# Patient Record
Sex: Male | Born: 1995 | Hispanic: Yes | Marital: Married | Smoking: Former smoker
Health system: Southern US, Community
[De-identification: ages and names within clinical notes are randomized; demographics above are authoritative.]

## PROBLEM LIST (undated history)

## (undated) ENCOUNTER — Emergency Department (HOSPITAL_COMMUNITY): Admission: EM | Payer: MEDICAID | Source: Home / Self Care

## (undated) DIAGNOSIS — K8020 Calculus of gallbladder without cholecystitis without obstruction: Secondary | ICD-10-CM

---
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
## 2016-07-19 IMAGING — CT CT NECK W/ CM
4 of 5 series · 14 of 33 positions shown, 16 images · IV contrast (omnipaque)
Comparison: None.

CLINICAL DATA: Acute onset of swelling of the right side of the
neck which began 1 week ago. No known injury. No significant past
medical history.

EXAM:
CT NECK WITH CONTRAST
TECHNIQUE: Multidetector CT imaging of the neck was performed using the
standard protocol following the bolus administration of intravenous
contrast.
CONTRAST:  80mL OMNIPAQUE IOHEXOL 300 MG/ML IV.

[Series 2: neck 2.0 i31s 3 · axial · 0.44mm/px · z∈[-266,-104]mm · 4 of 136 slices shown, 5 images]
[im 28/136  soft-tissue]
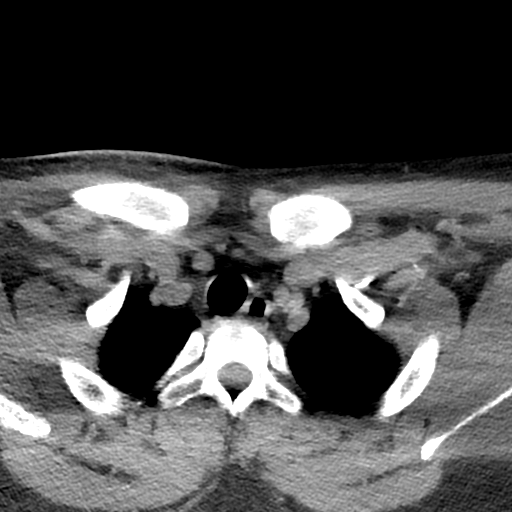
[im 28/136  bone]
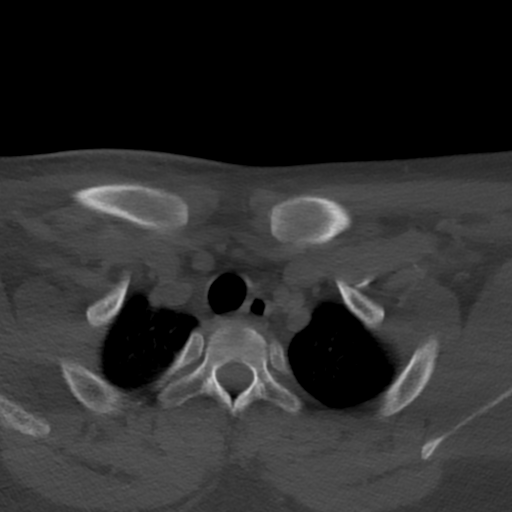
[im 55/136  bone]
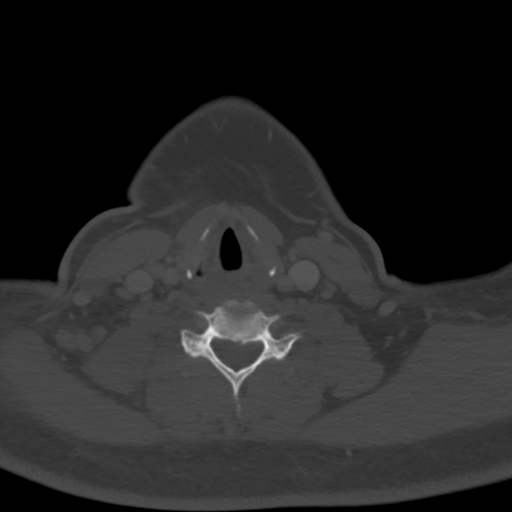
[im 82/136  bone]
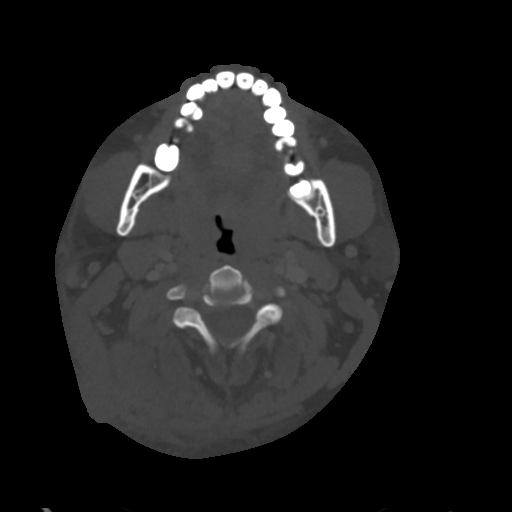
[im 109/136  bone]
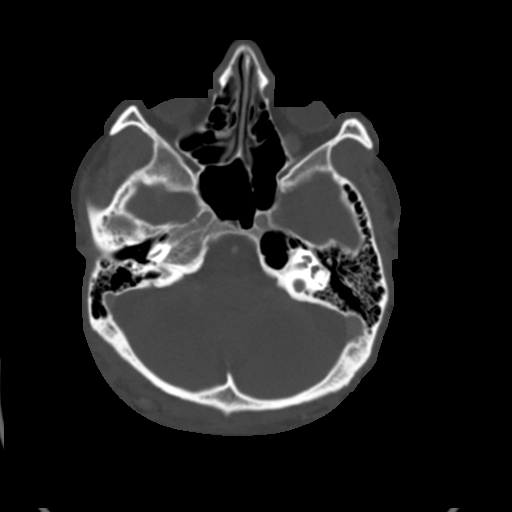

[Series 5: coronal st · coronal · 0.39mm/px · 3 of 111 slices shown]
[im 23/111  bone]
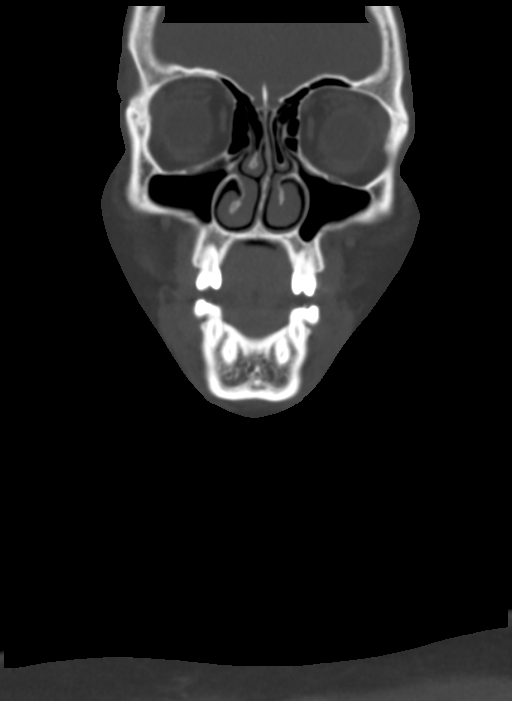
[im 45/111  bone]
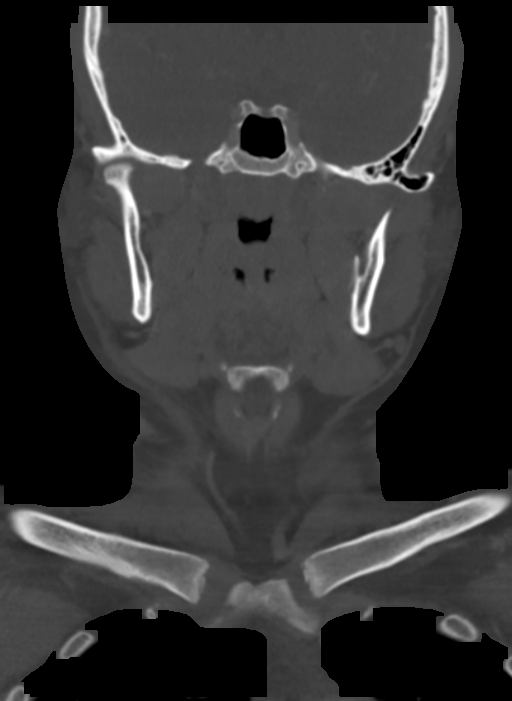
[im 67/111  bone]
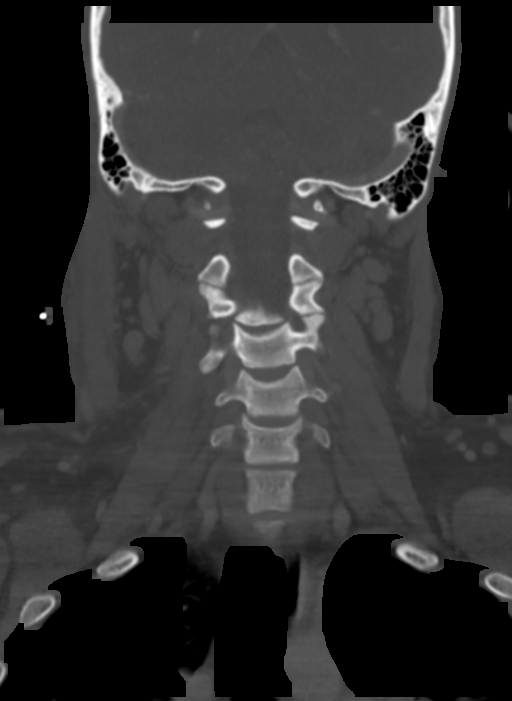

[Series 6: sagittal st · sagittal · 0.46mm/px · 5 of 92 slices shown, 6 images]
[im 31/92  bone]
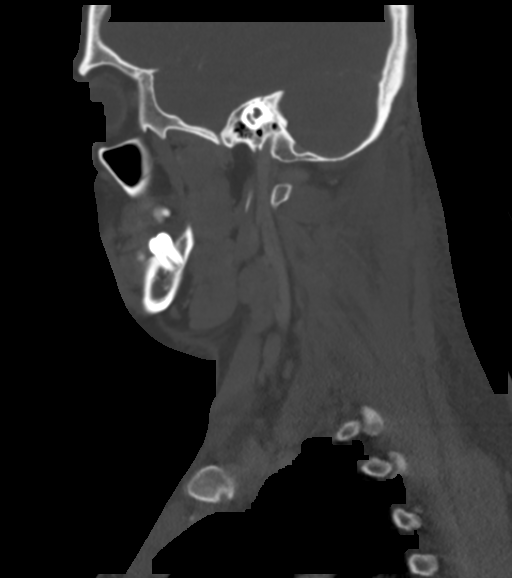
[im 38/92  bone]
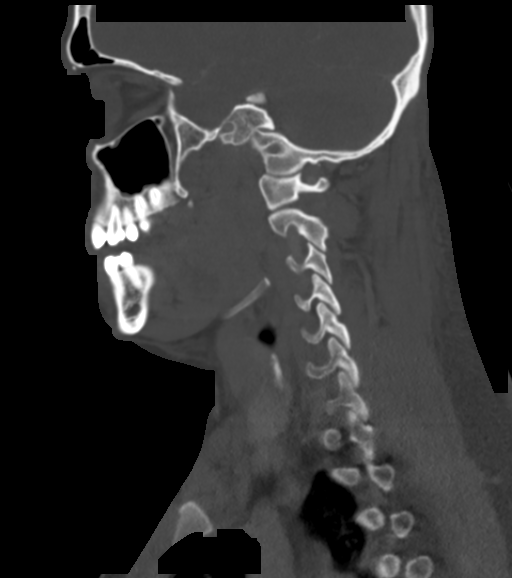
[im 46/92  soft-tissue]
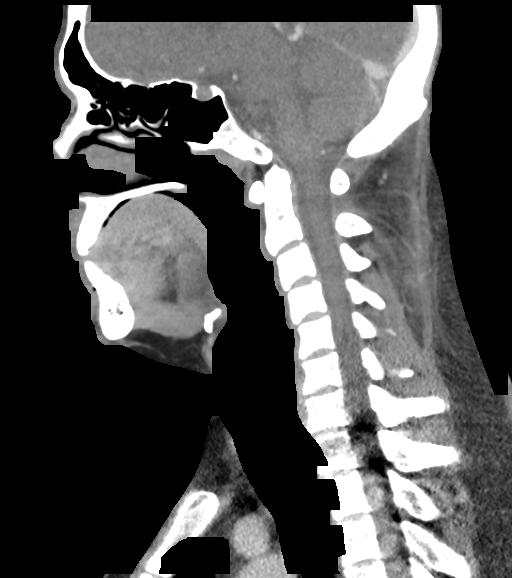
[im 46/92  bone]
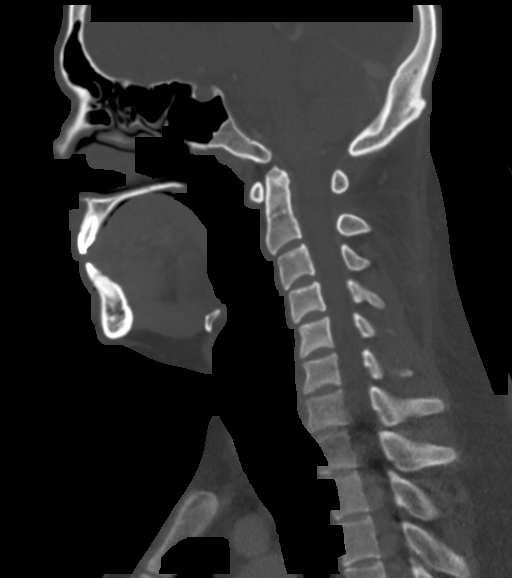
[im 54/92  bone]
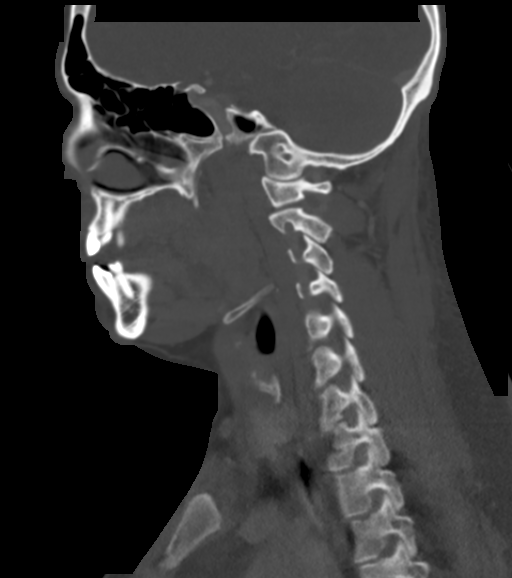
[im 61/92  bone]
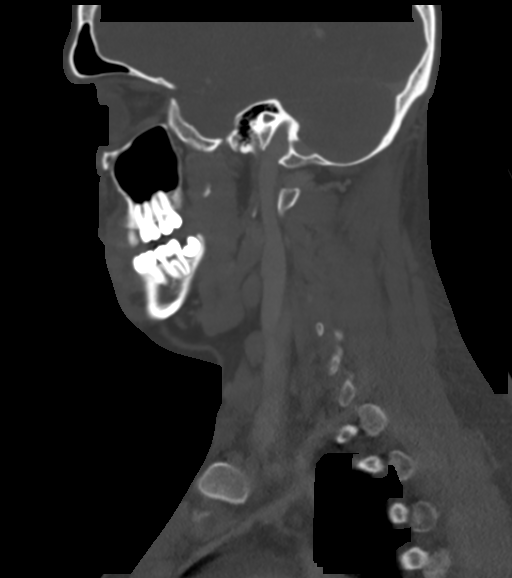

[Series 7: orthogonal st · axial · 0.39mm/px · z∈[-258,-206]mm · 2 of 129 slices shown]
[im 26/129  bone]
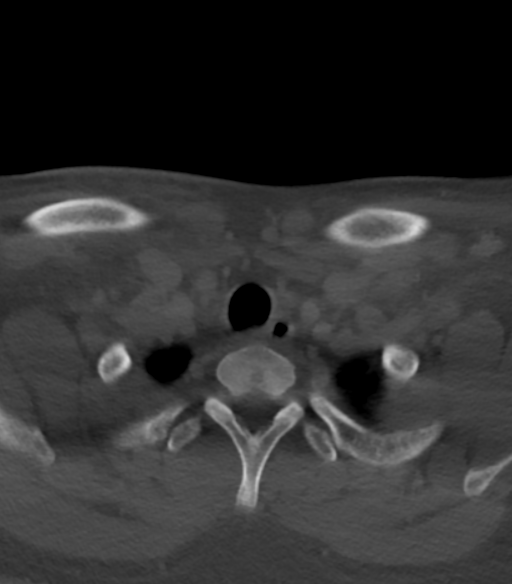
[im 52/129  bone]
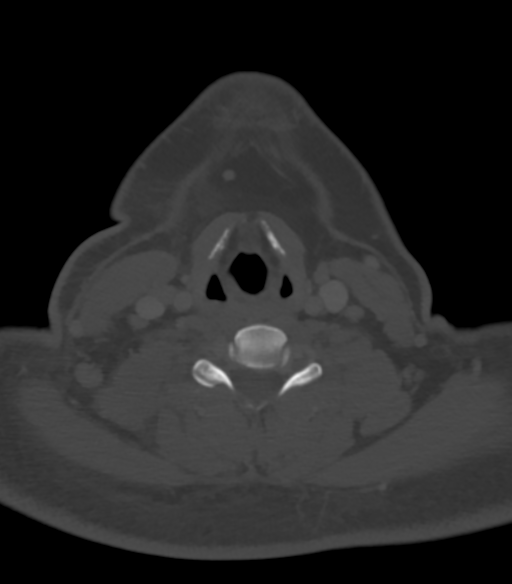

[14 of 33 positions shown; findings below may reference images not displayed]

FINDINGS: Extensive induration/edema involving the subcutaneous fat with
extension through the superficial fascia in the right posterior
neck. There is no discrete fluid collection to confirm an abscess
currently, though there is more focal with phlegmonous change in the
subcutaneous fat which measures approximately 2.8 x 2.6 x 3.9 cm
which appears to be organizing into an abscess. There is
edema/inflammation involving the posterior right cervical
musculature, including the right sternocleidomastoid muscle, with
edema in the fat planes between these muscles.

Pharynx and larynx: Symmetric mildly prominent lingual and
parapharyngeal tonsillar tissue, normal for patient age. No evidence
of parapharyngeal abscess.

Salivary glands: Normal and symmetric bilaterally.

Thyroid: Normal.

Lymph nodes: Symmetric bilateral enlarged level 2A cervical nodes.
Scattered bubbles mildly enlarged bilateral level IIb cervical
nodes, right greater than left. Mildly enlarged right level 5 nodes.
These nodes are felt to be reactive to the inflammatory process
described above.

Vascular: Patent internal jugular veins and external jugular veins
bilaterally. No carotid atherosclerosis.

Limited intracranial: Unremarkable.

Visualized orbits: Normal.

Mastoids and visualized paranasal sinuses: Well-aerated.

Skeleton: Unremarkable.

Upper chest: Visualized lung apices clear. Thymic tissue in the
anterior superior mediastinum. No lymphadenopathy in the visualized
mediastinum.
IMPRESSION: 1. Severe edema/inflammation involving the right posterior neck with
involvement of the subcutaneous fat and extension through the
superficial fascia to involve the posterior right neck muscles,
including the sternocleidomastoid muscle.
2. More focal phlegmonous change in the subcutaneous fat which may
be organizing into an abscess, though there is no drainable fluid
collection currently.
3. Reactive lymphadenopathy in both sides of the neck, right greater
than left.

## 2020-11-15 ENCOUNTER — Encounter (HOSPITAL_COMMUNITY): Payer: Self-pay

## 2020-11-15 ENCOUNTER — Other Ambulatory Visit: Payer: Self-pay

## 2020-11-15 ENCOUNTER — Emergency Department (HOSPITAL_COMMUNITY): Payer: Self-pay

## 2020-11-15 ENCOUNTER — Emergency Department (HOSPITAL_COMMUNITY)
Admission: EM | Admit: 2020-11-15 | Discharge: 2020-11-15 | Disposition: A | Discharge status: Home / Self Care | Payer: Self-pay | Attending: Emergency Medicine | Admitting: Emergency Medicine

## 2020-11-15 DIAGNOSIS — F17210 Nicotine dependence, cigarettes, uncomplicated: Secondary | ICD-10-CM | POA: Insufficient documentation

## 2020-11-15 DIAGNOSIS — R1011 Right upper quadrant pain: Secondary | ICD-10-CM

## 2020-11-15 DIAGNOSIS — K8020 Calculus of gallbladder without cholecystitis without obstruction: Secondary | ICD-10-CM | POA: Insufficient documentation

## 2020-11-15 LAB — COMPREHENSIVE METABOLIC PANEL
ALT: 47 U/L — ABNORMAL HIGH (ref 0–44)
AST: 18 U/L (ref 15–41)
Albumin: 4.1 g/dL (ref 3.5–5.0)
Alkaline Phosphatase: 114 U/L (ref 38–126)
Anion gap: 8 (ref 5–15)
BUN: 10 mg/dL (ref 6–20)
CO2: 24 mmol/L (ref 22–32)
Calcium: 8.7 mg/dL — ABNORMAL LOW (ref 8.9–10.3)
Chloride: 103 mmol/L (ref 98–111)
Creatinine, Ser: 0.73 mg/dL (ref 0.61–1.24)
GFR, Estimated: >60 mL/min (ref >60)
Glucose, Bld: 105 mg/dL — ABNORMAL HIGH (ref 70–99)
Potassium: 4.2 mmol/L (ref 3.5–5.1)
Sodium: 135 mmol/L (ref 135–145)
Total Bilirubin: 0.5 mg/dL (ref 0.3–1.2)
Total Protein: 6.7 g/dL (ref 6.5–8.1)

## 2020-11-15 LAB — CBC WITH DIFFERENTIAL/PLATELET
Abs Immature Granulocytes: 0.03 10*3/uL (ref 0.00–0.07)
Basophils Absolute: 0 10*3/uL (ref 0.0–0.1)
Basophils Relative: 0 %
Eosinophils Absolute: 0.2 10*3/uL (ref 0.0–0.5)
Eosinophils Relative: 2 %
HCT: 44.2 % (ref 39.0–52.0)
Hemoglobin: 14.3 g/dL (ref 13.0–17.0)
Immature Granulocytes: 0 %
Lymphocytes Relative: 24 %
Lymphs Abs: 2.4 10*3/uL (ref 0.7–4.0)
MCH: 28.3 pg (ref 26.0–34.0)
MCHC: 32.4 g/dL (ref 30.0–36.0)
MCV: 87.4 fL (ref 80.0–100.0)
Monocytes Absolute: 1 10*3/uL (ref 0.1–1.0)
Monocytes Relative: 10 %
Neutro Abs: 6.3 10*3/uL (ref 1.7–7.7)
Neutrophils Relative %: 64 %
Platelets: 239 10*3/uL (ref 150–400)
RBC: 5.06 MIL/uL (ref 4.22–5.81)
RDW: 12.4 % (ref 11.5–15.5)
WBC: 9.8 10*3/uL (ref 4.0–10.5)
nRBC: 0 % (ref 0.0–0.2)

## 2020-11-15 LAB — LIPASE, BLOOD: Lipase: 31 U/L (ref 11–51)

## 2020-11-15 MED ORDER — OXYCODONE-ACETAMINOPHEN 5-325 MG PO TABS: 1.0000 | ORAL_TABLET | ORAL | 0 refills | Status: DC | PRN

## 2020-11-15 MED ORDER — ONDANSETRON 4 MG PO TBDP
4.0000 mg | ORAL_TABLET | Freq: Once | ORAL | Status: AC
  Administered 2020-11-15: 4 mg via ORAL
  Filled 2020-11-15: qty 1

## 2020-11-15 MED ORDER — OXYCODONE-ACETAMINOPHEN 5-325 MG PO TABS
2.0000 | ORAL_TABLET | Freq: Once | ORAL | Status: AC
  Administered 2020-11-15: 2 via ORAL
  Filled 2020-11-15: qty 2

## 2020-11-15 MED ORDER — ONDANSETRON 4 MG PO TBDP: 4.0000 mg | ORAL_TABLET | Freq: Three times a day (TID) | ORAL | 0 refills | Status: DC | PRN

## 2020-11-15 NOTE — ED Triage Notes (Addendum)
Patient arrived with complaints of RUQ pain that radiates to his back over the last few hours but states this has happened in the past before and it resolved on its own. Reports taking pepto bismol and tylenol prior to arrival.

## 2020-11-15 NOTE — ED Notes (Signed)
Discharge instructions reviewed with patient. Medications and rx explained. Pt understands and repeats instructions. Pt verbalizes he will follow up with surgery.

## 2020-11-15 NOTE — ED Notes (Signed)
Pt reports recurrent RUQ pain that began at 0100. Pt sts pain usually resolves on own but today was worse.

## 2020-11-15 NOTE — Discharge Instructions (Signed)
Your labs today were normal.  Ultrasound showed gallstones, this is the source of your pain. See attached for more information about this.  Recommend to try eating low fat diet to reduce flare ups. Take the prescribed medication as directed for pain/nausea when needed. Follow-up in the general surgery clinic-- call for appointment. Return to the ED for new or worsening symptoms.

## 2020-11-15 NOTE — ED Provider Notes (Signed)
[facility] DEPT Provider Note   CSN: [id number] Arrival date & time: 11/15/20  0511     History Chief Complaint  Patient presents with  . Abdominal Pain    Andrew Rowe is a 25 y.o. male.  The history is provided by the patient and medical records.  Abdominal Pain Associated symptoms: nausea     25 year old male presenting to the ED with epigastric and right upper quadrant pain that began tonight.  States it is sharp and stabbing and radiates around to his right back.  Has had some nausea but denies vomiting.  Cannot specify pain is worse with eating.  States he has had similar pain in the past but usually will resolve on its own.  Has no known history of gallbladder disease.  No prior abdominal surgeries.  He did try taking Tylenol and Pepto-Bismol at home without relief.  History reviewed. No pertinent past medical history.  Patient Active Problem List   Diagnosis Date Noted  . Neck abscess 06/03/2015  . Carbuncle and Furuncle of Neck 06/03/2015    Past Surgical History:  Procedure Laterality Date  . RADICAL NECK DISSECTION Right 06/03/2015   Procedure: IRRIGATION AND DEBRIDEMENT OF RIGHT POSTERIOR NECK CARBUNCLE;  Surgeon: Jodi Marble, MD;  Location: [facility] OR;  Service: ENT;  Laterality: Right;       Family History  Problem Relation Age of Onset  . Diabetes Mother   . Hypertension Mother   . Diabetes Maternal Aunt   . Hypertension Maternal Aunt   . Diabetes Maternal Grandmother   . Hypertension Maternal Grandmother     Social History   Tobacco Use  . Smoking status: Current Every Day Smoker    Packs/day: 1.00    Types: Cigarettes  Substance Use Topics  . Alcohol use: Yes    Comment: occ  . Drug use: No    Home Medications Prior to Admission medications   Medication Sig Start Date End Date Taking? Authorizing Provider  clindamycin (CLEOCIN) 300 MG capsule Take 1 capsule (300 mg total) by mouth 3 (three) times daily. 06/06/15    Hosie Poisson, MD  mupirocin ointment (BACTROBAN) 2 % Place 1 application into the nose 2 (two) times daily. 06/06/15   Hosie Poisson, MD  oxyCODONE-acetaminophen (PERCOCET/ROXICET) 5-325 MG per tablet Take 1-2 tablets by mouth every 4 (four) hours as needed for moderate pain. 06/06/15   Hosie Poisson, MD    Allergies    Patient has no known allergies.  Review of Systems   Review of Systems  Gastrointestinal: Positive for abdominal pain and nausea.  All other systems reviewed and are negative.   Physical Exam Updated Vital Signs BP 139/83 (BP Location: Right Arm)   Pulse 95   Temp 97.7 F (36.5 C) (Oral)   Resp 18   SpO2 100%   Physical Exam Vitals and nursing note reviewed.  Constitutional:      Appearance: He is well-developed and well-nourished.  HENT:     Head: Normocephalic and atraumatic.     Mouth/Throat:     Mouth: Oropharynx is clear and moist.  Eyes:     Extraocular Movements: EOM normal.     Conjunctiva/sclera: Conjunctivae normal.     Pupils: Pupils are equal, round, and reactive to light.  Cardiovascular:     Rate and Rhythm: Normal rate and regular rhythm.     Heart sounds: Normal heart sounds.  Pulmonary:     Effort: Pulmonary effort is normal.     Breath  sounds: Normal breath sounds.  Abdominal:     General: Bowel sounds are normal.     Palpations: Abdomen is soft.     Tenderness: There is abdominal tenderness in the right upper quadrant and epigastric area.     Comments: Very mild tenderness in the epigastrium and right upper quadrant, no Murphy's sign  Musculoskeletal:        General: Normal range of motion.     Cervical back: Normal range of motion.  Skin:    General: Skin is warm and dry.  Neurological:     Mental Status: He is alert and oriented to person, place, and time.  Psychiatric:        Mood and Affect: Mood and affect normal.     ED Results / Procedures / Treatments   Labs (all labs ordered are listed, but only abnormal results  are displayed) Labs Reviewed  COMPREHENSIVE METABOLIC PANEL - Abnormal; Notable for the following components:      Result Value   Glucose, Bld 105 (*)    Calcium 8.7 (*)    ALT 47 (*)    All other components within normal limits  CBC WITH DIFFERENTIAL/PLATELET  LIPASE, BLOOD    EKG None  Radiology US Abdomen Limited RUQ (LIVER/GB)  Result Date: 11/15/2020 CLINICAL DATA:  Right upper quadrant pain. EXAM: ULTRASOUND ABDOMEN LIMITED RIGHT UPPER QUADRANT COMPARISON:  No prior. FINDINGS: Gallbladder: 1.9 cm gallstone noted the neck of the gallbladder. Gallbladder sludge noted. Gallbladder wall thickness normal. Negative Murphy sign. No pericholecystic fluid collection. Common bile duct: Diameter: There is Liver: Mild increased echogenicity suggesting fatty infiltration. Portal vein is patent on color Doppler imaging with normal direction of blood flow towards the liver. Other: None. IMPRESSION: 1. 1.9 cm gallstone noted in the neck of the gallbladder. Gallbladder sludge noted. No evidence of gallbladder wall thickening. Negative Murphy sign. No pericholecystic fluid collection. No biliary distention. 2. Mild fatty infiltration of the liver. Electronically Signed   By: Marcello Moores  Register   On: 11/15/2020 06:29    Procedures Procedures   Medications Ordered in ED Medications  oxyCODONE-acetaminophen (PERCOCET/ROXICET) 5-325 MG per tablet 2 tablet (2 tablets Oral Given 11/15/20 0549)  ondansetron (ZOFRAN-ODT) disintegrating tablet 4 mg (4 mg Oral Given 11/15/20 9629)    ED Course  I have reviewed the triage vital signs and the nursing notes.  Pertinent labs & imaging results that were available during my care of the patient were reviewed by me and considered in my medical decision making (see chart for details).    MDM Rules/Calculators/A&P  25 y.o. M here with epigastric and RUQ pain.  Has had this previsouly, resolved on its own.  Tried tylenol and pepto at home without relief.  He is  afebrile, non-toxic, NAD.  Tenderness in epigastrium and RUQ without murphy's sign.  Will obtain labs, RUQ [place]. Given percocet, zofran.  Labs grossly reassuring, normal white count.  Normal LFTs, alk phos, and bili.  Ultrasound with 1.9 cm gallstone in neck of gallbladder.  Does have some gallbladder sludge but there is no wall thickening, pericholecystic fluid and no Murphy's sign.  There are some findings of fatty liver noted as well.  Given reassuring lab findings, feel he stable for discharge with close outpatient surgical follow-up.  Advised on low-fat diet and given spanish instructions for such.  Rx percocet, zofran.  Return here for any new/acute changes.  Final Clinical Impression(s) / ED Diagnoses Final diagnoses:  RUQ pain  Gallstones  Rx / DC Orders ED Discharge Orders         Ordered    oxyCODONE-acetaminophen (PERCOCET) 5-325 MG tablet  Every 4 hours PRN        11/15/20 0637    ondansetron (ZOFRAN ODT) 4 MG disintegrating tablet  Every 8 hours PRN        11/15/20 0637           Larene Pickett, PA-C 11/15/20 [id number]    Fatima Blank, MD 11/16/20 [phone]

## 2020-11-26 ENCOUNTER — Emergency Department (HOSPITAL_COMMUNITY)
Admission: EM | Admit: 2020-11-26 | Discharge: 2020-11-26 | Disposition: A | Discharge status: Home / Self Care | Payer: Self-pay | Attending: Emergency Medicine | Admitting: Emergency Medicine

## 2020-11-26 ENCOUNTER — Other Ambulatory Visit: Payer: Self-pay

## 2020-11-26 ENCOUNTER — Emergency Department (HOSPITAL_COMMUNITY): Payer: Self-pay

## 2020-11-26 DIAGNOSIS — K8020 Calculus of gallbladder without cholecystitis without obstruction: Secondary | ICD-10-CM | POA: Insufficient documentation

## 2020-11-26 DIAGNOSIS — R110 Nausea: Secondary | ICD-10-CM | POA: Insufficient documentation

## 2020-11-26 DIAGNOSIS — R109 Unspecified abdominal pain: Secondary | ICD-10-CM

## 2020-11-26 DIAGNOSIS — F17210 Nicotine dependence, cigarettes, uncomplicated: Secondary | ICD-10-CM | POA: Insufficient documentation

## 2020-11-26 LAB — COMPREHENSIVE METABOLIC PANEL
ALT: 30 U/L (ref 0–44)
AST: 26 U/L (ref 15–41)
Albumin: 4.3 g/dL (ref 3.5–5.0)
Alkaline Phosphatase: 112 U/L (ref 38–126)
Anion gap: 10 (ref 5–15)
BUN: 16 mg/dL (ref 6–20)
CO2: 20 mmol/L — ABNORMAL LOW (ref 22–32)
Calcium: 9 mg/dL (ref 8.9–10.3)
Chloride: 104 mmol/L (ref 98–111)
Creatinine, Ser: 0.91 mg/dL (ref 0.61–1.24)
GFR, Estimated: >60 mL/min (ref >60)
Glucose, Bld: 105 mg/dL — ABNORMAL HIGH (ref 70–99)
Potassium: 4.8 mmol/L (ref 3.5–5.1)
Sodium: 134 mmol/L — ABNORMAL LOW (ref 135–145)
Total Bilirubin: 1.3 mg/dL — ABNORMAL HIGH (ref 0.3–1.2)
Total Protein: 7.2 g/dL (ref 6.5–8.1)

## 2020-11-26 LAB — CBC WITH DIFFERENTIAL/PLATELET
Abs Immature Granulocytes: 0.05 10*3/uL (ref 0.00–0.07)
Basophils Absolute: 0.1 10*3/uL (ref 0.0–0.1)
Basophils Relative: 1 %
Eosinophils Absolute: 0.3 10*3/uL (ref 0.0–0.5)
Eosinophils Relative: 2 %
HCT: 47.2 % (ref 39.0–52.0)
Hemoglobin: 15.7 g/dL (ref 13.0–17.0)
Immature Granulocytes: 0 %
Lymphocytes Relative: 18 %
Lymphs Abs: 2.3 10*3/uL (ref 0.7–4.0)
MCH: 28.8 pg (ref 26.0–34.0)
MCHC: 33.3 g/dL (ref 30.0–36.0)
MCV: 86.6 fL (ref 80.0–100.0)
Monocytes Absolute: 1 10*3/uL (ref 0.1–1.0)
Monocytes Relative: 8 %
Neutro Abs: 8.9 10*3/uL — ABNORMAL HIGH (ref 1.7–7.7)
Neutrophils Relative %: 71 %
Platelets: 231 10*3/uL (ref 150–400)
RBC: 5.45 MIL/uL (ref 4.22–5.81)
RDW: 12.6 % (ref 11.5–15.5)
WBC: 12.6 10*3/uL — ABNORMAL HIGH (ref 4.0–10.5)
nRBC: 0 % (ref 0.0–0.2)

## 2020-11-26 LAB — LIPASE, BLOOD: Lipase: 38 U/L (ref 11–51)

## 2020-11-26 MED ORDER — ONDANSETRON HCL 4 MG/2ML IJ SOLN
4.0000 mg | Freq: Once | INTRAMUSCULAR | Status: AC
  Administered 2020-11-26: 4 mg via INTRAVENOUS
  Filled 2020-11-26: qty 2

## 2020-11-26 MED ORDER — MORPHINE SULFATE (PF) 4 MG/ML IV SOLN
4.0000 mg | Freq: Once | INTRAVENOUS | Status: AC
  Administered 2020-11-26: 4 mg via INTRAVENOUS
  Filled 2020-11-26: qty 1

## 2020-11-26 MED ORDER — SODIUM CHLORIDE 0.9 % IV BOLUS
1000.0000 mL | Freq: Once | INTRAVENOUS | Status: AC
  Administered 2020-11-26: 1000 mL via INTRAVENOUS

## 2020-11-26 MED ORDER — HYDROCODONE-ACETAMINOPHEN 5-325 MG PO TABS: 1.0000 | ORAL_TABLET | ORAL | 0 refills | Status: DC | PRN

## 2020-11-26 NOTE — ED Triage Notes (Signed)
Pt came in with c/o RUQ pain that started at approx 0100. Pt took recently prescribed oxycodone with no relief. Pt is solely Spanish speaking. Pt has been seen here for same. Rates pain 10/10

## 2020-11-26 NOTE — ED Provider Notes (Signed)
[facility]-EMERGENCY DEPT Provider Note   CSN: [id number] Arrival date & time: 11/26/20  0344     History Chief Complaint  Patient presents with  . Abdominal Pain    Andrew Rowe is a 25 y.o. male.  25 yo M with recurrent right upper quadrant abdominal pain. Started this time about 5 hours ago. Described as sharp and severe. Having some nausea. No reported fevers. Had been seen in the ED previously and was found to have a very large gallstone.  The history is provided by the patient.  Abdominal Pain Pain location:  RUQ Pain quality: sharp and shooting   Pain radiates to:  Does not radiate Pain severity:  Moderate Onset quality:  Gradual Duration:  5 hours Timing:  Constant Progression:  Worsening Chronicity:  Recurrent Relieved by:  Nothing Worsened by:  Nothing Ineffective treatments:  None tried Associated symptoms: nausea   Associated symptoms: no chest pain, no chills, no diarrhea, no fever, no shortness of breath and no vomiting        No past medical history on file.  Patient Active Problem List   Diagnosis Date Noted  . Neck abscess 06/03/2015  . Carbuncle and Furuncle of Neck 06/03/2015    Past Surgical History:  Procedure Laterality Date  . RADICAL NECK DISSECTION Right 06/03/2015   Procedure: IRRIGATION AND DEBRIDEMENT OF RIGHT POSTERIOR NECK CARBUNCLE;  Surgeon: Flo Shanks, MD;  Location: [facility] OR;  Service: ENT;  Laterality: Right;       Family History  Problem Relation Age of Onset  . Diabetes Mother   . Hypertension Mother   . Diabetes Maternal Aunt   . Hypertension Maternal Aunt   . Diabetes Maternal Grandmother   . Hypertension Maternal Grandmother     Social History   Tobacco Use  . Smoking status: Current Every Day Smoker    Packs/day: 1.00    Types: Cigarettes  Substance Use Topics  . Alcohol use: Yes    Comment: occ  . Drug use: No    Home Medications Prior to Admission medications   Medication  Sig Start Date End Date Taking? Authorizing Provider  clindamycin (CLEOCIN) 300 MG capsule Take 1 capsule (300 mg total) by mouth 3 (three) times daily. 06/06/15   Kathlen Mody, MD  mupirocin ointment (BACTROBAN) 2 % Place 1 application into the nose 2 (two) times daily. 06/06/15   Kathlen Mody, MD  ondansetron (ZOFRAN ODT) 4 MG disintegrating tablet Take 1 tablet (4 mg total) by mouth every 8 (eight) hours as needed for nausea. 11/15/20   Garlon Hatchet, PA-C  oxyCODONE-acetaminophen (PERCOCET) 5-325 MG tablet Take 1 tablet by mouth every 4 (four) hours as needed. 11/15/20   Garlon Hatchet, PA-C    Allergies    Patient has no known allergies.  Review of Systems   Review of Systems  Constitutional: Negative for chills and fever.  HENT: Negative for congestion and facial swelling.   Eyes: Negative for discharge and visual disturbance.  Respiratory: Negative for shortness of breath.   Cardiovascular: Negative for chest pain and palpitations.  Gastrointestinal: Positive for abdominal pain and nausea. Negative for diarrhea and vomiting.  Musculoskeletal: Negative for arthralgias and myalgias.  Skin: Negative for color change and rash.  Neurological: Negative for tremors, syncope and headaches.  Psychiatric/Behavioral: Negative for confusion and dysphoric mood.    Physical Exam Updated Vital Signs BP (!) 155/101 (BP Location: Right Arm)   Pulse 91   Temp 98.6 F (37  C) (Oral)   Resp 15   Ht 5\' 5"  (1.651 m)   Wt 95.7 kg   SpO2 100%   BMI 35.11 kg/m   Physical Exam Vitals and nursing note reviewed.  Constitutional:      Appearance: He is well-developed and well-nourished.  HENT:     Head: Normocephalic and atraumatic.  Eyes:     Extraocular Movements: EOM normal.     Pupils: Pupils are equal, round, and reactive to light.  Neck:     Vascular: No JVD.  Cardiovascular:     Rate and Rhythm: Normal rate and regular rhythm.     Heart sounds: No murmur heard. No friction rub. No  gallop.   Pulmonary:     Effort: No respiratory distress.     Breath sounds: No wheezing.  Abdominal:     General: There is no distension.     Tenderness: There is abdominal tenderness (pain worst to the RUQ). There is guarding (ruq). There is no rebound. Negative signs include Murphy's sign.  Musculoskeletal:        General: Normal range of motion.     Cervical back: Normal range of motion and neck supple.  Skin:    Coloration: Skin is not pale.     Findings: No rash.  Neurological:     Mental Status: He is alert and oriented to person, place, and time.  Psychiatric:        Mood and Affect: Mood and affect normal.        Behavior: Behavior normal.     ED Results / Procedures / Treatments   Labs (all labs ordered are listed, but only abnormal results are displayed) Labs Reviewed  COMPREHENSIVE METABOLIC PANEL - Abnormal; Notable for the following components:      Result Value   Sodium 134 (*)    CO2 20 (*)    Glucose, Bld 105 (*)    Total Bilirubin 1.3 (*)    All other components within normal limits  CBC WITH DIFFERENTIAL/PLATELET - Abnormal; Notable for the following components:   WBC 12.6 (*)    Neutro Abs 8.9 (*)    All other components within normal limits  LIPASE, BLOOD    EKG None  Radiology Abdomen Limited RUQ (LIVER/GB)  Result Date: 11/26/2020 CLINICAL DATA:  25 year old male with right upper quadrant abdominal pain. EXAM: ULTRASOUND ABDOMEN LIMITED RIGHT UPPER QUADRANT COMPARISON:  Ultrasound 11/15/2020. FINDINGS: Gallbladder: Less gallbladder sludge today. A roughly 22 mm stone remains in the gallbladder neck (image 7) and appears non mobile on the decubitus images (image 35). Gallbladder wall thickness is at the upper limits of normal, 2-3 mm. No pericholecystic fluid. No sonographic Murphy sign elicited. Common bile duct: Diameter: 4 mm, within normal limits. Liver: Stable liver parenchyma, no intrahepatic biliary ductal dilatation. No discrete liver  lesion. Portal vein is patent on color Doppler imaging with normal direction of blood flow towards the liver. Other: Negative visible right kidney.  No free fluid. IMPRESSION: 1. A 22 mm stone appears lodged in the gallbladder neck since earlier this month. However, absent sonographic Murphy sign, and gallbladder wall thickness at the upper limits of normal, argue against acute cholecystitis. 2. Bile ducts remain within normal limits. Electronically Signed   By: 01/13/2021 M.D.   On: 11/26/2020 06:57    Procedures Procedures   Medications Ordered in ED Medications  morphine 4 MG/ML injection 4 mg (4 mg Intravenous Given 11/26/20 0629)  ondansetron (ZOFRAN) injection 4 mg (  4 mg Intravenous Given 11/26/20 0629)  sodium chloride 0.9 % bolus 1,000 mL (1,000 mLs Intravenous New Bag/Given 11/26/20 1007)    ED Course  I have reviewed the triage vital signs and the nursing notes.  Pertinent labs & imaging results that were available during my care of the patient were reviewed by me and considered in my medical decision making (see chart for details).    MDM Rules/Calculators/A&P                          25 yo M with a chief complaints of right upper quadrant abdominal pain. Seen here about 10 days ago with the same. Found to have a very large stone in the gallbladder neck. Will repeat lab evaluation. Repeat ultrasound.  Signed out to Dr. Freida Busman, please see his note for further details of care in the ED.  The patients results and plan were reviewed and discussed.   Any x-rays performed were independently reviewed by myself.   Differential diagnosis were considered with the presenting HPI.  Medications  morphine 4 MG/ML injection 4 mg (4 mg Intravenous Given 11/26/20 0629)  ondansetron (ZOFRAN) injection 4 mg (4 mg Intravenous Given 11/26/20 0629)  sodium chloride 0.9 % bolus 1,000 mL (1,000 mLs Intravenous New Bag/Given 11/26/20 0629)    Vitals:   11/26/20 0350  BP: (!) 155/101  Pulse: 91   Resp: 15  Temp: 98.6 F (37 C)  TempSrc: Oral  SpO2: 100%  Weight: 95.7 kg  Height: 5\' 5"  (1.651 m)    Final diagnoses:  Abdominal pain  Calculus of gallbladder without cholecystitis without obstruction     Final Clinical Impression(s) / ED Diagnoses Final diagnoses:  Abdominal pain  Calculus of gallbladder without cholecystitis without obstruction    Rx / DC Orders ED Discharge Orders    None       , DO 11/26/20 11/28/20

## 2020-11-26 NOTE — ED Provider Notes (Signed)
Patient's pain is controlled at this time.  Laboratory studies are reassuring.  Will give referral to general surgery.  Video interpreter used   Lorre Nick, MD 11/26/20 [phone]

## 2020-12-11 ENCOUNTER — Encounter (HOSPITAL_COMMUNITY): Payer: Self-pay | Admitting: Emergency Medicine

## 2020-12-11 ENCOUNTER — Observation Stay (HOSPITAL_COMMUNITY)
Admission: EM | Admit: 2020-12-11 | Discharge: 2020-12-12 | Disposition: A | Discharge status: Home / Self Care | Payer: Self-pay | Attending: Surgery | Admitting: Surgery

## 2020-12-11 ENCOUNTER — Other Ambulatory Visit: Payer: Self-pay

## 2020-12-11 ENCOUNTER — Emergency Department (HOSPITAL_COMMUNITY): Payer: Self-pay

## 2020-12-11 DIAGNOSIS — K8020 Calculus of gallbladder without cholecystitis without obstruction: Secondary | ICD-10-CM | POA: Diagnosis present

## 2020-12-11 DIAGNOSIS — R1011 Right upper quadrant pain: Secondary | ICD-10-CM

## 2020-12-11 DIAGNOSIS — Z87891 Personal history of nicotine dependence: Secondary | ICD-10-CM | POA: Insufficient documentation

## 2020-12-11 DIAGNOSIS — Z20822 Contact with and (suspected) exposure to covid-19: Secondary | ICD-10-CM | POA: Insufficient documentation

## 2020-12-11 HISTORY — DX: Calculus of gallbladder without cholecystitis without obstruction: K80.20

## 2020-12-11 LAB — COMPREHENSIVE METABOLIC PANEL
ALT: 45 U/L — ABNORMAL HIGH (ref 0–44)
AST: 18 U/L (ref 15–41)
Albumin: 4.2 g/dL (ref 3.5–5.0)
Alkaline Phosphatase: 86 U/L (ref 38–126)
Anion gap: 11 (ref 5–15)
BUN: 10 mg/dL (ref 6–20)
CO2: 24 mmol/L (ref 22–32)
Calcium: 9.1 mg/dL (ref 8.9–10.3)
Chloride: 102 mmol/L (ref 98–111)
Creatinine, Ser: 0.83 mg/dL (ref 0.61–1.24)
GFR, Estimated: >60 mL/min (ref >60)
Glucose, Bld: 112 mg/dL — ABNORMAL HIGH (ref 70–99)
Potassium: 3.5 mmol/L (ref 3.5–5.1)
Sodium: 137 mmol/L (ref 135–145)
Total Bilirubin: 1.5 mg/dL — ABNORMAL HIGH (ref 0.3–1.2)
Total Protein: 6.9 g/dL (ref 6.5–8.1)

## 2020-12-11 LAB — URINALYSIS, ROUTINE W REFLEX MICROSCOPIC
Bilirubin Urine: NEGATIVE
Glucose, UA: NEGATIVE mg/dL
Hgb urine dipstick: NEGATIVE
Ketones, ur: NEGATIVE mg/dL
Leukocytes,Ua: NEGATIVE
Nitrite: NEGATIVE
Protein, ur: NEGATIVE mg/dL
Specific Gravity, Urine: 1.027 (ref 1.005–1.030)
pH: 5 (ref 5.0–8.0)

## 2020-12-11 LAB — RESP PANEL BY RT-PCR (FLU A&B, COVID) ARPGX2
Influenza A by PCR: NEGATIVE
Influenza B by PCR: NEGATIVE
SARS Coronavirus 2 by RT PCR: NEGATIVE

## 2020-12-11 LAB — CBC
HCT: 44.7 % (ref 39.0–52.0)
Hemoglobin: 15.1 g/dL (ref 13.0–17.0)
MCH: 29 pg (ref 26.0–34.0)
MCHC: 33.8 g/dL (ref 30.0–36.0)
MCV: 85.8 fL (ref 80.0–100.0)
Platelets: 268 10*3/uL (ref 150–400)
RBC: 5.21 MIL/uL (ref 4.22–5.81)
RDW: 12.6 % (ref 11.5–15.5)
WBC: 9.5 10*3/uL (ref 4.0–10.5)
nRBC: 0 % (ref 0.0–0.2)

## 2020-12-11 LAB — SURGICAL PCR SCREEN
MRSA, PCR: NEGATIVE
Staphylococcus aureus: POSITIVE — AB

## 2020-12-11 LAB — LIPASE, BLOOD: Lipase: 30 U/L (ref 11–51)

## 2020-12-11 MED ORDER — ENOXAPARIN SODIUM 40 MG/0.4ML ~~LOC~~ SOLN
40.0000 mg | Freq: Every day | SUBCUTANEOUS | Status: DC
  Administered 2020-12-11: 40 mg via SUBCUTANEOUS
  Filled 2020-12-11: qty 0.4

## 2020-12-11 MED ORDER — DEXTROSE-NACL 5-0.45 % IV SOLN: INTRAVENOUS | Status: DC

## 2020-12-11 MED ORDER — SODIUM CHLORIDE 0.9 % IV BOLUS
1000.0000 mL | Freq: Once | INTRAVENOUS | Status: AC
  Administered 2020-12-11: 1000 mL via INTRAVENOUS

## 2020-12-11 MED ORDER — ONDANSETRON HCL 4 MG/2ML IJ SOLN: 4.0000 mg | Freq: Four times a day (QID) | INTRAMUSCULAR | Status: DC | PRN

## 2020-12-11 MED ORDER — DIPHENHYDRAMINE HCL 25 MG PO CAPS
25.0000 mg | ORAL_CAPSULE | Freq: Four times a day (QID) | ORAL | Status: DC | PRN
Start: 2020-12-11 — End: 2020-12-12

## 2020-12-11 MED ORDER — ONDANSETRON 4 MG PO TBDP: 4.0000 mg | ORAL_TABLET | Freq: Four times a day (QID) | ORAL | Status: DC | PRN

## 2020-12-11 MED ORDER — ONDANSETRON HCL 4 MG/2ML IJ SOLN
4.0000 mg | Freq: Once | INTRAMUSCULAR | Status: AC
  Administered 2020-12-11: 4 mg via INTRAVENOUS
  Filled 2020-12-11: qty 2

## 2020-12-11 MED ORDER — OXYCODONE HCL 5 MG PO TABS
5.0000 mg | ORAL_TABLET | ORAL | Status: DC | PRN
Start: 2020-12-11 — End: 2020-12-12

## 2020-12-11 MED ORDER — ACETAMINOPHEN 325 MG PO TABS: 650.0000 mg | ORAL_TABLET | Freq: Four times a day (QID) | ORAL | Status: DC | PRN

## 2020-12-11 MED ORDER — MORPHINE SULFATE (PF) 4 MG/ML IV SOLN
4.0000 mg | Freq: Once | INTRAVENOUS | Status: AC
Start: 2020-12-11 — End: 2020-12-11
  Administered 2020-12-11: 4 mg via INTRAVENOUS
  Filled 2020-12-11: qty 1

## 2020-12-11 MED ORDER — ACETAMINOPHEN 650 MG RE SUPP: 650.0000 mg | Freq: Four times a day (QID) | RECTAL | Status: DC | PRN

## 2020-12-11 MED ORDER — DIPHENHYDRAMINE HCL 50 MG/ML IJ SOLN: 25.0000 mg | Freq: Four times a day (QID) | INTRAMUSCULAR | Status: DC | PRN

## 2020-12-11 NOTE — ED Triage Notes (Signed)
Patient coming from home. Complaint of gallstones x4 weeks that is causing abdominal pain. A&Ox4. NAD.

## 2020-12-11 NOTE — H&P (Signed)
Andrew Rowe 05/17/1996  [id number].    Requesting MD: Jodi Geralds, PA-C Chief Complaint/Reason for Consult: RUQ abdominal pain  HPI: Andrew Rowe is a 25 y.o. male with a hx of gallstones who presented to [facility] ED for RUQ abdominal pain.   Patient was dx at an outside hospital several weeks ago with gallstones. He reports since that time he has been having daily episodes of RUQ abdominal pain that is worse after eating and at night. Yesterday, after eating pizza he developed similar RUQ abdominal pain with associated nausea that did not relieve and prompted his visit to the ED. He reports after pain medication in the ED his pain has resolved. He denies prior abdominal surgeries. No daily medications or pmhx.   In the ED he was afebrile with tachycardia to 118 and BP of 101/42. Most recent HR 100 and BP 116/94. WBC 9.5, T.bili 1.5, ALT 45, LFT's otherwise wnl, lipase 30. RUQ [place] w/ 1.5 cm calculus near the gallbladder neck. No gallbladder wall thickening. CBD 1-82mm. We were asked to see.   ROS: Review of Systems  Constitutional: Negative for chills and fever.  Respiratory: Negative for cough.   Cardiovascular: Negative for chest pain and leg swelling.  Gastrointestinal: Positive for abdominal pain and nausea. Negative for vomiting.  Musculoskeletal: Negative for back pain.  Psychiatric/Behavioral: Positive for substance abuse.  All other systems reviewed and are negative.   No family history on file.  History reviewed. No pertinent past medical history.  History reviewed. No pertinent surgical history.  Social History:  has no history on file for tobacco use, alcohol use, and drug use. Patient reports occasional alcohol use. No tobacco use. 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 Is not employed. Lives at home with wife and his son.   Allergies: No Known Allergies  (Not in a hospital admission)    Physical Exam: Blood pressure (!) 116/94, pulse 100, temperature 99.6 F (37.6  C), temperature source Oral, resp. rate 16, height 5\' 6"  (1.676 m), weight 81.6 kg, SpO2 99 %. General: pleasant, WD/WN male who is laying in bed in NAD HEENT: head is normocephalic, atraumatic.  Sclera are noninjected.  PERRL.  Ears and nose without any masses or lesions.  Mouth is pink and moist. Dentition fair Heart: regular, rate, and rhythm.  Normal s1,s2. No obvious murmurs, gallops, or rubs noted.  Palpable pedal pulses bilaterally  Lungs: CTAB, no wheezes, rhonchi, or rales noted.  Respiratory effort nonlabored Abd: Soft, NT/ND, +BS, no masses, hernias, or organomegaly MS: no BUE/BLE edema, calves soft and nontender Skin: warm and dry with no masses, lesions, or rashes Psych: A&Ox4 with an appropriate affect Neuro: cranial nerves grossly intact, equal strength in BUE/BLE bilaterally, normal speech, thought process intact  Results for orders placed or performed during the hospital encounter of 12/11/20 (from the past 48 hour(s))  Lipase, blood     Status: None   Collection Time: 12/11/20  9:43 AM  Result Value Ref Range   Lipase 30 11 - 51 U/L    Comment: Performed at [facility], [address] [place], [place]  Comprehensive metabolic panel     Status: Abnormal   Collection Time: 12/11/20  9:43 AM  Result Value Ref Range   Sodium 137 135 - 145 mmol/L   Potassium 3.5 3.5 - 5.1 mmol/L   Chloride 102 98 - 111 mmol/L   CO2 24 22 - 32 mmol/L   Glucose, Bld 112 (H) 70 -  99 mg/dL    Comment: Glucose reference range applies only to samples taken after fasting for at least 8 hours.   BUN 10 6 - 20 mg/dL   Creatinine, Ser 0.17 0.61 - 1.24 mg/dL   Calcium 9.1 8.9 - 51.0 mg/dL   Total Protein 6.9 6.5 - 8.1 g/dL   Albumin 4.2 3.5 - 5.0 g/dL   AST 18 15 - 41 U/L   ALT 45 (H) 0 - 44 U/L   Alkaline Phosphatase 86 38 - 126 U/L   Total Bilirubin 1.5 (H) 0.3 - 1.2 mg/dL   GFR, Estimated >25 >85 mL/min    Comment: (NOTE) Calculated using the CKD-EPI Creatinine Equation  (2021)    Anion gap 11 5 - 15    Comment: Performed at [facility], [address], [place], [place] [address]  CBC     Status: None   Collection Time: 12/11/20  9:43 AM  Result Value Ref Range   WBC 9.5 4.0 - 10.5 K/uL   RBC 5.21 4.22 - 5.81 MIL/uL   Hemoglobin 15.1 13.0 - 17.0 g/dL   HCT 42.3 53.6 - 14.4 %   MCV 85.8 80.0 - 100.0 fL   MCH 29.0 26.0 - 34.0 pg   MCHC 33.8 30.0 - 36.0 g/dL   RDW 31.5 40.0 - 86.7 %   Platelets 268 150 - 400 K/uL   nRBC 0.0 0.0 - 0.2 %    Comment: Performed at [facility], [address] [place], [place] [address]  Urinalysis, Routine w reflex microscopic     Status: None   Collection Time: 12/11/20  1:24 PM  Result Value Ref Range   Color, Urine YELLOW YELLOW   APPearance CLEAR CLEAR   Specific Gravity, Urine 1.027 1.005 - 1.030   pH 5.0 5.0 - 8.0   Glucose, UA NEGATIVE NEGATIVE mg/dL   Hgb urine dipstick NEGATIVE NEGATIVE   Bilirubin Urine NEGATIVE NEGATIVE   Ketones, ur NEGATIVE NEGATIVE mg/dL   Protein, ur NEGATIVE NEGATIVE mg/dL   Nitrite NEGATIVE NEGATIVE   Leukocytes,Ua NEGATIVE NEGATIVE    Comment: Performed at [facility], [address], [place], [place] [address]   US Abdomen Limited RUQ (LIVER/GB)  Result Date: 12/11/2020 CLINICAL DATA:  Right upper quadrant abdominal pain. EXAM: ULTRASOUND ABDOMEN LIMITED RIGHT UPPER QUADRANT COMPARISON:  No pertinent prior exams available for comparison. FINDINGS: Gallbladder: The gallbladder is somewhat underdistended. Cholecystolithiasis. This includes a 1.5 cm calculus near the gallbladder neck. No appreciable gallbladder wall thickening. No sonographic Eulah Pont sign is elicited by the scanning technologist. Common bile duct: Diameter: 1-2 mm, within normal limits Liver: No focal lesion identified. Normal parenchymal echogenicity. Portal vein is patent on color Doppler imaging with normal direction of blood flow towards the liver. IMPRESSION: Cholecystolithiasis. This includes a  1.5 cm calculus near the gallbladder neck. No sonographic evidence of acute cholecystitis. A nuclear medicine HIDA scan may be helpful for further evaluation, if clinically warranted. Electronically Signed   By: Jackey Loge DO   On: 12/11/2020 12:59   Anti-infectives (From admission, onward)   None       Assessment/Plan Symptomatic Cholelithiasis w/ possible Acute Cholecystitis - Will plan for admission to observation and OR in the morning for laparoscopic cholecystectomy - Will plan patient on abx for coverage incase he has early cholecystitis - AM labs - Okay for cld, NPO at midnight - Mobilize - Pulm toilet   FEN - CLD, NPO at midnight  VTE -  SCDs, Loveonx  ID - [id number]   Jacinto Halim, [facility] Surgery 12/11/2020, 2:04 PM Please see Amion for pager number during day hours 7:00am-4:30pm

## 2020-12-11 NOTE — ED Notes (Signed)
Patient transported to Ultrasound 

## 2020-12-11 NOTE — ED Provider Notes (Signed)
[facility] EMERGENCY DEPARTMENT Provider Note   CSN: [id number] Arrival date & time: 12/11/20  0174     History Chief Complaint  Patient presents with   Abdominal Pain    Antwione Picotte is a 25 y.o. male.  Purnell Daigle is a 25 y.o. male with a history of gallstones, who presents to the emergency department for evaluation of right upper quadrant abdominal pain. Patient reports this episode began last night and has persisted this morning. He reports severe focal pain in the right upper quadrant that has not resolved. Pain started after eating pizza for dinner last night. He reports some associated nausea, no vomiting, reports normal bowel movements, no constipation, diarrhea or blood in the stool. Has had some chills but no noted fevers at home. Was seen in outside hospital 6 weeks ago and diagnosed with gallstones, he states since then he has had intermittent pains in this area almost daily, but some days it is worse than others and last night's pain was particularly severe. He was prescribed oxycodone after initial diagnosis but ran out of this a few weeks ago and has not been using anything else to treat the pain.  No previous abdominal surgeries. No other aggravating or alleviating factors.   Spanish interpreter used to obtain history.        Past Medical History:  Diagnosis Date   Gallstones     Patient Active Problem List   Diagnosis Date Noted   Symptomatic cholelithiasis 12/11/2020    History reviewed. No pertinent surgical history.     No family history on file.  Social History   Tobacco Use   Smoking status: Former Smoker   Smokeless tobacco: Former Engineer, production Medications Prior to Admission medications   Not on File    Allergies    Patient has no known allergies.  Review of Systems   Review of Systems  Constitutional: Positive for chills. Negative for fever.  HENT: Negative.   Respiratory: Negative for cough and shortness of  breath.   Cardiovascular: Negative for chest pain.  Gastrointestinal: Positive for abdominal pain and nausea. Negative for blood in stool, constipation, diarrhea and vomiting.  Genitourinary: Negative for dysuria and frequency.  Musculoskeletal: Negative for arthralgias and myalgias.  Skin: Negative for color change and rash.  Neurological: Negative for dizziness, syncope and light-headedness.  All other systems reviewed and are negative.   Physical Exam Updated Vital Signs BP (!) 101/42    Pulse 100    Temp 99.6 F (37.6 C) (Oral)    Resp 17    Ht 5\' 6"  (1.676 m)    Wt 81.6 kg    SpO2 100%    BMI 29.05 kg/m   Physical Exam Vitals and nursing note reviewed.  Constitutional:      General: He is not in acute distress.    Appearance: He is well-developed and well-nourished. He is not diaphoretic.     Comments: Well-appearing and in no distress   HENT:     Head: Normocephalic and atraumatic.     Mouth/Throat:     Mouth: Oropharynx is clear and moist.  Eyes:     General:        Right eye: No discharge.        Left eye: No discharge.     Extraocular Movements: EOM normal.     Pupils: Pupils are equal, round, and reactive to light.  Cardiovascular:     Rate and Rhythm: Normal rate and  regular rhythm.     Pulses: Intact distal pulses.     Heart sounds: Normal heart sounds.  Pulmonary:     Effort: Pulmonary effort is normal. No respiratory distress.     Breath sounds: Normal breath sounds. No wheezing or rales.     Comments: Respirations equal and unlabored, patient able to speak in full sentences, lungs clear to auscultation bilaterally  Abdominal:     General: Bowel sounds are normal. There is no distension.     Palpations: Abdomen is soft. There is no mass.     Tenderness: There is abdominal tenderness in the right upper quadrant. There is no guarding.     Comments: Abdomen is soft, nondistended, bowel sounds present throughout, there is focal right upper quadrant tenderness  without guarding, negative Murphy sign, all other quadrants nontender.  Musculoskeletal:        General: No deformity or edema.     Cervical back: Neck supple.  Skin:    General: Skin is warm and dry.     Capillary Refill: Capillary refill takes less than 2 seconds.  Neurological:     Mental Status: He is alert.     Coordination: Coordination normal.     Comments: Speech is clear, able to follow commands Moves extremities without ataxia, coordination intact  Psychiatric:        Mood and Affect: Mood normal.        Behavior: Behavior normal.     ED Results / Procedures / Treatments   Labs (all labs ordered are listed, but only abnormal results are displayed) Labs Reviewed  COMPREHENSIVE METABOLIC PANEL - Abnormal; Notable for the following components:      Result Value   Glucose, Bld 112 (*)    ALT 45 (*)    Total Bilirubin 1.5 (*)    All other components within normal limits  RESP PANEL BY RT-PCR (FLU A&B, COVID) ARPGX2  LIPASE, BLOOD  CBC  URINALYSIS, ROUTINE W REFLEX MICROSCOPIC  HIV ANTIBODY (ROUTINE TESTING W REFLEX)    EKG None  Radiology US Abdomen Limited RUQ (LIVER/GB)  Result Date: 12/11/2020 CLINICAL DATA:  Right upper quadrant abdominal pain. EXAM: ULTRASOUND ABDOMEN LIMITED RIGHT UPPER QUADRANT COMPARISON:  No pertinent prior exams available for comparison. FINDINGS: Gallbladder: The gallbladder is somewhat underdistended. Cholecystolithiasis. This includes a 1.5 cm calculus near the gallbladder neck. No appreciable gallbladder wall thickening. No sonographic Eulah Pont sign is elicited by the scanning technologist. Common bile duct: Diameter: 1-2 mm, within normal limits Liver: No focal lesion identified. Normal parenchymal echogenicity. Portal vein is patent on color Doppler imaging with normal direction of blood flow towards the liver. IMPRESSION: Cholecystolithiasis. This includes a 1.5 cm calculus near the gallbladder neck. No sonographic evidence of acute  cholecystitis. A nuclear medicine HIDA scan may be helpful for further evaluation, if clinically warranted. Electronically Signed   By: Jackey Loge DO   On: 12/11/2020 12:59    Procedures Procedures   Medications Ordered in ED Medications  enoxaparin (LOVENOX) injection 40 mg (has no administration in time range)  dextrose 5 %-0.45 % sodium chloride infusion (has no administration in time range)  acetaminophen (TYLENOL) tablet 650 mg (has no administration in time range)    Or  acetaminophen (TYLENOL) suppository 650 mg (has no administration in time range)  oxyCODONE (Oxy IR/ROXICODONE) immediate release tablet 5 mg (has no administration in time range)  diphenhydrAMINE (BENADRYL) capsule 25 mg (has no administration in time range)    Or  diphenhydrAMINE (  BENADRYL) injection 25 mg (has no administration in time range)  ondansetron (ZOFRAN-ODT) disintegrating tablet 4 mg (has no administration in time range)    Or  ondansetron (ZOFRAN) injection 4 mg (has no administration in time range)  sodium chloride 0.9 % bolus 1,000 mL (0 mLs Intravenous Stopped 12/11/20 1153)  ondansetron (ZOFRAN) injection 4 mg (4 mg Intravenous Given 12/11/20 1002)  morphine 4 MG/ML injection 4 mg (4 mg Intravenous Given 12/11/20 1003)    ED Course  I have reviewed the triage vital signs and the nursing notes.  Pertinent labs & imaging results that were available during my care of the patient were reviewed by me and considered in my medical decision making (see chart for details).    MDM Rules/Calculators/A&P                         25 year old male with known history of gallstones who presents with worsening right upper quadrant abdominal pain.  He was diagnosed with gallstones 6 weeks ago, states that since then he has had brief episodes of right upper quadrant pain almost daily but since last night after eating pizza has had persistent right upper quadrant pain with nausea.  On arrival he has a low-grade  temp of 99.6 sinus tachycardia.  High clinical suspicion for cholecystitis given location of pain and known gallstones.  Will evaluate with right upper quadrant ultrasound and lab work.  Differential also includes symptomatic cholelithiasis, hepatitis, choledocholithiasis, pancreatitis, gastritis, GERD, PUD.  I have independently ordered, reviewed and interpreted all labs and imaging: No CBC: No leukocytosis, normal hemoglobin CMP: Glucose 112, mildly elevated bilirubin of 1.5 and ALT of 45, LFTs otherwise unremarkable, normal renal function. Lipase: WNL UA: No evidence of infection Covid screening: Negative  Right upper quadrant ultrasound shows cholelithiasis with 1.5 cm calculus at the gallbladder neck, there is no current signs of cholecystitis, but given the location of the large gallstone this seems likely inevitable.  On reevaluation after patient received IV fluids, pain and nausea medicine he is now much more comfortable, but given location of stone will consult general surgery.  Case discussed with PA Leary Roca who will come and see the patient, after evaluation general surgery will plan to perform cholecystectomy tomorrow.  Patient will be admitted to general surgery service.  Final Clinical Impression(s) / ED Diagnoses Final diagnoses:  RUQ abdominal pain  Symptomatic cholelithiasis    Rx / DC Orders ED Discharge Orders    None       Legrand Rams 12/11/20 1618    Arby Barrette, MD 12/11/20 1655

## 2020-12-12 ENCOUNTER — Observation Stay (HOSPITAL_COMMUNITY): Payer: Self-pay | Admitting: Anesthesiology

## 2020-12-12 ENCOUNTER — Other Ambulatory Visit: Payer: Self-pay | Admitting: Physician Assistant

## 2020-12-12 ENCOUNTER — Encounter (HOSPITAL_COMMUNITY)
Admission: EM | Disposition: A | Discharge status: Home / Self Care | Payer: Self-pay | Source: Home / Self Care | Attending: Emergency Medicine

## 2020-12-12 HISTORY — PX: CHOLECYSTECTOMY: SHX55

## 2020-12-12 LAB — COMPREHENSIVE METABOLIC PANEL
ALT: 33 U/L (ref 0–44)
AST: 13 U/L — ABNORMAL LOW (ref 15–41)
Albumin: 3.5 g/dL (ref 3.5–5.0)
Alkaline Phosphatase: 68 U/L (ref 38–126)
Anion gap: 9 (ref 5–15)
BUN: 5 mg/dL — ABNORMAL LOW (ref 6–20)
CO2: 25 mmol/L (ref 22–32)
Calcium: 8.8 mg/dL — ABNORMAL LOW (ref 8.9–10.3)
Chloride: 101 mmol/L (ref 98–111)
Creatinine, Ser: 0.76 mg/dL (ref 0.61–1.24)
GFR, Estimated: >60 mL/min (ref >60)
Glucose, Bld: 108 mg/dL — ABNORMAL HIGH (ref 70–99)
Potassium: 3.4 mmol/L — ABNORMAL LOW (ref 3.5–5.1)
Sodium: 135 mmol/L (ref 135–145)
Total Bilirubin: 1.5 mg/dL — ABNORMAL HIGH (ref 0.3–1.2)
Total Protein: 6.1 g/dL — ABNORMAL LOW (ref 6.5–8.1)

## 2020-12-12 LAB — HIV ANTIBODY (ROUTINE TESTING W REFLEX): HIV Screen 4th Generation wRfx: NONREACTIVE

## 2020-12-12 SURGERY — LAPAROSCOPIC CHOLECYSTECTOMY
Anesthesia: General | Site: Abdomen

## 2020-12-12 MED ORDER — SUGAMMADEX SODIUM 200 MG/2ML IV SOLN
INTRAVENOUS | Status: DC | PRN
  Administered 2020-12-12: 200 mg via INTRAVENOUS

## 2020-12-12 MED ORDER — SODIUM CHLORIDE 0.9 % IR SOLN
Status: DC | PRN
  Administered 2020-12-12: 1000 mL

## 2020-12-12 MED ORDER — ACETAMINOPHEN 10 MG/ML IV SOLN: 1000.0000 mg | Freq: Once | INTRAVENOUS | Status: DC | PRN

## 2020-12-12 MED ORDER — ROCURONIUM BROMIDE 10 MG/ML (PF) SYRINGE
PREFILLED_SYRINGE | INTRAVENOUS | Status: DC | PRN
  Administered 2020-12-12: 60 mg via INTRAVENOUS

## 2020-12-12 MED ORDER — LACTATED RINGERS IV SOLN: INTRAVENOUS | Status: DC

## 2020-12-12 MED ORDER — BUPIVACAINE HCL (PF) 0.25 % IJ SOLN
INTRAMUSCULAR | Status: AC
  Filled 2020-12-12: qty 30

## 2020-12-12 MED ORDER — AMISULPRIDE (ANTIEMETIC) 5 MG/2ML IV SOLN: 10.0000 mg | Freq: Once | INTRAVENOUS | Status: DC | PRN

## 2020-12-12 MED ORDER — PROPOFOL 10 MG/ML IV BOLUS
INTRAVENOUS | Status: AC
  Filled 2020-12-12: qty 40

## 2020-12-12 MED ORDER — OXYCODONE HCL 5 MG PO TABS
5.0000 mg | ORAL_TABLET | ORAL | Status: DC | PRN
  Administered 2020-12-12: 5 mg via ORAL
  Filled 2020-12-12: qty 1

## 2020-12-12 MED ORDER — OXYCODONE HCL 5 MG/5ML PO SOLN: 5.0000 mg | Freq: Once | ORAL | Status: DC | PRN

## 2020-12-12 MED ORDER — METHOCARBAMOL 500 MG PO TABS: 500.0000 mg | ORAL_TABLET | Freq: Four times a day (QID) | ORAL | Status: DC | PRN

## 2020-12-12 MED ORDER — MIDAZOLAM HCL 2 MG/2ML IJ SOLN
INTRAMUSCULAR | Status: AC
  Filled 2020-12-12: qty 2

## 2020-12-12 MED ORDER — FENTANYL CITRATE (PF) 250 MCG/5ML IJ SOLN
INTRAMUSCULAR | Status: DC | PRN
  Administered 2020-12-12: 100 ug via INTRAVENOUS
  Administered 2020-12-12 (×3): 50 ug via INTRAVENOUS

## 2020-12-12 MED ORDER — 0.9 % SODIUM CHLORIDE (POUR BTL) OPTIME
TOPICAL | Status: DC | PRN
  Administered 2020-12-12: 1000 mL

## 2020-12-12 MED ORDER — CHLORHEXIDINE GLUCONATE CLOTH 2 % EX PADS: 6.0000 | MEDICATED_PAD | Freq: Every day | CUTANEOUS | Status: DC

## 2020-12-12 MED ORDER — MUPIROCIN 2 % EX OINT
1.0000 "application " | TOPICAL_OINTMENT | Freq: Two times a day (BID) | CUTANEOUS | Status: DC
  Administered 2020-12-12: 1 via NASAL
  Filled 2020-12-12: qty 22

## 2020-12-12 MED ORDER — FENTANYL CITRATE (PF) 250 MCG/5ML IJ SOLN
INTRAMUSCULAR | Status: AC
  Filled 2020-12-12: qty 5

## 2020-12-12 MED ORDER — ACETAMINOPHEN 325 MG PO TABS: 650.0000 mg | ORAL_TABLET | Freq: Four times a day (QID) | ORAL | Status: AC | PRN

## 2020-12-12 MED ORDER — BUPIVACAINE HCL (PF) 0.25 % IJ SOLN
INTRAMUSCULAR | Status: DC | PRN
  Administered 2020-12-12: 20 mL

## 2020-12-12 MED ORDER — LIDOCAINE 2% (20 MG/ML) 5 ML SYRINGE
INTRAMUSCULAR | Status: DC | PRN
  Administered 2020-12-12: 80 mg via INTRAVENOUS

## 2020-12-12 MED ORDER — CHLORHEXIDINE GLUCONATE 0.12 % MT SOLN
OROMUCOSAL | Status: AC
  Administered 2020-12-12: 15 mL via OROMUCOSAL
  Filled 2020-12-12: qty 15

## 2020-12-12 MED ORDER — MIDAZOLAM HCL 5 MG/5ML IJ SOLN
INTRAMUSCULAR | Status: DC | PRN
  Administered 2020-12-12: 2 mg via INTRAVENOUS

## 2020-12-12 MED ORDER — CHLORHEXIDINE GLUCONATE 0.12 % MT SOLN: 15.0000 mL | Freq: Once | OROMUCOSAL | Status: AC

## 2020-12-12 MED ORDER — PROPOFOL 10 MG/ML IV BOLUS
INTRAVENOUS | Status: DC | PRN
  Administered 2020-12-12: 150 mg via INTRAVENOUS

## 2020-12-12 MED ORDER — FENTANYL CITRATE (PF) 100 MCG/2ML IJ SOLN: 25.0000 ug | INTRAMUSCULAR | Status: DC | PRN

## 2020-12-12 MED ORDER — PHENYLEPHRINE 40 MCG/ML (10ML) SYRINGE FOR IV PUSH (FOR BLOOD PRESSURE SUPPORT)
PREFILLED_SYRINGE | INTRAVENOUS | Status: DC | PRN
  Administered 2020-12-12: 80 ug via INTRAVENOUS

## 2020-12-12 MED ORDER — DEXAMETHASONE SODIUM PHOSPHATE 10 MG/ML IJ SOLN
INTRAMUSCULAR | Status: DC | PRN
  Administered 2020-12-12: 10 mg via INTRAVENOUS

## 2020-12-12 MED ORDER — OXYCODONE HCL 5 MG PO TABS: 5.0000 mg | ORAL_TABLET | ORAL | 0 refills | Status: DC | PRN

## 2020-12-12 MED ORDER — CEFAZOLIN SODIUM-DEXTROSE 2-4 GM/100ML-% IV SOLN
2.0000 g | INTRAVENOUS | Status: AC
  Administered 2020-12-12: 2 g via INTRAVENOUS
  Filled 2020-12-12: qty 100

## 2020-12-12 MED ORDER — OXYCODONE HCL 5 MG PO TABS: 5.0000 mg | ORAL_TABLET | Freq: Once | ORAL | Status: DC | PRN

## 2020-12-12 MED ORDER — ONDANSETRON HCL 4 MG/2ML IJ SOLN
INTRAMUSCULAR | Status: DC | PRN
  Administered 2020-12-12: 4 mg via INTRAVENOUS

## 2020-12-12 MED ORDER — PROMETHAZINE HCL 25 MG/ML IJ SOLN
6.2500 mg | INTRAMUSCULAR | Status: DC | PRN
Start: 2020-12-12 — End: 2020-12-12

## 2020-12-12 MED ORDER — CHLORHEXIDINE GLUCONATE 4 % EX LIQD
1.0000 "application " | Freq: Once | CUTANEOUS | Status: AC
  Administered 2020-12-12: 1 via TOPICAL
  Filled 2020-12-12: qty 15

## 2020-12-12 MED FILL — oxyCODONE HCL 5 MG TABS: 5 | 3 days supply | Qty: 15 | Fill #0

## 2020-12-12 SURGICAL SUPPLY — 42 items
APPLIER CLIP 5 13 M/L LIGAMAX5 (MISCELLANEOUS) ×2
BLADE CLIPPER SURG (BLADE) ×2 IMPLANT
CANISTER SUCT 3000ML PPV (MISCELLANEOUS) ×2 IMPLANT
CHLORAPREP W/TINT 26 (MISCELLANEOUS) ×2 IMPLANT
CLIP APPLIE 5 13 M/L LIGAMAX5 (MISCELLANEOUS) ×1 IMPLANT
COVER SURGICAL LIGHT HANDLE (MISCELLANEOUS) ×2 IMPLANT
COVER WAND RF STERILE (DRAPES) IMPLANT
DERMABOND ADVANCED (GAUZE/BANDAGES/DRESSINGS) ×1
DERMABOND ADVANCED .7 DNX12 (GAUZE/BANDAGES/DRESSINGS) ×1 IMPLANT
ELECT REM PT RETURN 9FT ADLT (ELECTROSURGICAL) ×2
ELECTRODE REM PT RTRN 9FT ADLT (ELECTROSURGICAL) ×1 IMPLANT
GLOVE BIOGEL PI IND STRL 6 (GLOVE) ×1 IMPLANT
GLOVE BIOGEL PI INDICATOR 6 (GLOVE) ×1
GLOVE BIOGEL PI MICRO 5.5 (GLOVE) ×1
GLOVE BIOGEL PI MICRO STRL 5.5 (GLOVE) ×1 IMPLANT
GOWN STRL REUS W/ TWL LRG LVL3 (GOWN DISPOSABLE) ×3 IMPLANT
GOWN STRL REUS W/TWL LRG LVL3 (GOWN DISPOSABLE) ×3
KIT BASIN OR (CUSTOM PROCEDURE TRAY) ×2 IMPLANT
KIT TURNOVER KIT B (KITS) ×2 IMPLANT
L-HOOK LAP DISP 36CM (ELECTROSURGICAL) ×2
LHOOK LAP DISP 36CM (ELECTROSURGICAL) ×1 IMPLANT
NEEDLE INSUFFLATION 14GA 120MM (NEEDLE) IMPLANT
NS IRRIG 1000ML POUR BTL (IV SOLUTION) ×2 IMPLANT
PAD ARMBOARD 7.5X6 YLW CONV (MISCELLANEOUS) ×2 IMPLANT
PENCIL BUTTON HOLSTER BLD 10FT (ELECTRODE) ×2 IMPLANT
POUCH SPECIMEN RETRIEVAL 10MM (ENDOMECHANICALS) ×2 IMPLANT
SCISSORS LAP 5X35 DISP (ENDOMECHANICALS) ×2 IMPLANT
SET IRRIG TUBING LAPAROSCOPIC (IRRIGATION / IRRIGATOR) ×2 IMPLANT
SET TUBE SMOKE EVAC HIGH FLOW (TUBING) ×2 IMPLANT
SLEEVE ENDOPATH XCEL 5M (ENDOMECHANICALS) ×4 IMPLANT
SPECIMEN JAR SMALL (MISCELLANEOUS) ×2 IMPLANT
SUT MNCRL AB 4-0 PS2 18 (SUTURE) ×2 IMPLANT
SUT VIC AB 3-0 SH 27 (SUTURE)
SUT VIC AB 3-0 SH 27XBRD (SUTURE) IMPLANT
SUT VICRYL 0 UR6 27IN ABS (SUTURE) ×2 IMPLANT
TOWEL GREEN STERILE (TOWEL DISPOSABLE) ×2 IMPLANT
TOWEL GREEN STERILE FF (TOWEL DISPOSABLE) ×2 IMPLANT
TRAY LAPAROSCOPIC MC (CUSTOM PROCEDURE TRAY) ×2 IMPLANT
TROCAR XCEL 12X100 BLDLESS (ENDOMECHANICALS) IMPLANT
TROCAR XCEL BLUNT TIP 100MML (ENDOMECHANICALS) ×2 IMPLANT
TROCAR XCEL NON-BLD 5MMX100MML (ENDOMECHANICALS) ×2 IMPLANT
WATER STERILE IRR 1000ML POUR (IV SOLUTION) ×2 IMPLANT

## 2020-12-12 NOTE — Progress Notes (Signed)
Video interpreter [phone] used for preop interview.

## 2020-12-12 NOTE — Discharge Summary (Signed)
[facility] Discharge Summary   Patient ID: Andrew Rowe MRN: [id number] DOB/AGE: 06-Jun-1996 25 y.o.  Admit date: 12/11/2020 Discharge date: 12/12/2020  Admitting Diagnosis: Symptomatic cholelithiasis   Discharge Diagnosis Patient Active Problem List   Diagnosis Date Noted   Symptomatic cholelithiasis 12/11/2020    Consultants None   Imaging: US Abdomen Limited RUQ (LIVER/GB)  Result Date: 12/11/2020 CLINICAL DATA:  Right upper quadrant abdominal pain. EXAM: ULTRASOUND ABDOMEN LIMITED RIGHT UPPER QUADRANT COMPARISON:  No pertinent prior exams available for comparison. FINDINGS: Gallbladder: The gallbladder is somewhat underdistended. Cholecystolithiasis. This includes a 1.5 cm calculus near the gallbladder neck. No appreciable gallbladder wall thickening. No sonographic Eulah Pont sign is elicited by the scanning technologist. Common bile duct: Diameter: 1-2 mm, within normal limits Liver: No focal lesion identified. Normal parenchymal echogenicity. Portal vein is patent on color Doppler imaging with normal direction of blood flow towards the liver. IMPRESSION: Cholecystolithiasis. This includes a 1.5 cm calculus near the gallbladder neck. No sonographic evidence of acute cholecystitis. A nuclear medicine HIDA scan may be helpful for further evaluation, if clinically warranted. Electronically Signed   By: Jackey Loge DO   On: 12/11/2020 12:59    Procedures Dr. Sophronia Simas (12/12/20) - Laparoscopic Cholecystectomy    Hospital Course:  Patient is a 25 year old male who presented to [facility] with abdominal pain.  Workup showed cholelithiasis with stone impacted in the neck of the gallbladder.  Patient was admitted and underwent procedure listed above.  Tolerated procedure well and was transferred to the floor.  Diet was advanced as tolerated.  On POD#0, the patient was voiding well, tolerating diet, ambulating well, pain well controlled, vital signs stable, incisions  c/d/i and felt stable for discharge home.  Patient will follow up in our office in 3 weeks and knows to call with questions or concerns.  He will call to confirm appointment date/time.    Physical Exam: General:  Alert, NAD, pleasant, comfortable Abd:  Soft, ND, mild tenderness, incisions C/D/I  I or a member of my team have reviewed this patient in the Controlled Substance Database.   Allergies as of 12/12/2020   No Known Allergies     Medication List    STOP taking these medications   Artificial Tears 1.4 % ophthalmic solution Generic drug: polyvinyl alcohol   oxyCODONE-acetaminophen 5-325 MG tablet Commonly known as: PERCOCET/ROXICET     TAKE these medications   acetaminophen 325 MG tablet Commonly known as: TYLENOL Take 2 tablets (650 mg total) by mouth every 6 (six) hours as needed for mild pain (or temp > 100).   ibuprofen 200 MG tablet Commonly known as: ADVIL Take 400 mg by mouth every 5 (five) hours as needed (pain).   oxyCODONE 5 MG immediate release tablet Commonly known as: Oxy IR/ROXICODONE Take 1-2 tablets (5-10 mg total) by mouth every 4 (four) hours as needed for moderate pain or severe pain (5mg  for moderate pain, 10mg  for severe pain).         Follow-up Information    [facility] 01/02/2021.   Specialty: General Surgery Why: La cita de seguimiento est programada para las 8:45 de la [place]. Llegue 30 minutos antes para registrarse. 01/04/2021 identificacin con foto e informacin del seguro. Contact information: [address] [place] [address] [place] [phone]               Signed: [id number] , [facility] Surgery 12/12/2020, 2:06 PM Please  see Amion for pager number during day hours 7:00am-4:30pm

## 2020-12-12 NOTE — Op Note (Signed)
Date: 12/12/20  Patient: [name] MRN: [id number]  Preoperative Diagnosis: Symptomatic cholelithiasis Postoperative Diagnosis: Same  Procedure: Laparoscopic cholecystectomy  Surgeon: Sophronia Simas, MD  EBL: Minimal  Anesthesia: General  Specimens: Gallbladder  Indications: Mr. Andrew Rowe is a 25 yo male who presented with worsening RUQ pain that occurred after every meal. RUQ US showed a large gallstone lodged in the neck of the gallbladder. After a discussion of the risks and benefits of surgery, the patient agreed to proceed with cholecystectomy.  Findings: Large gallstone in the gallbladder neck. Chronic cholecystitis.  Procedure details: Informed consent was obtained in the preoperative area prior to the procedure. The patient was brought to the operating room and placed on the table in the supine position. General anesthesia was induced and appropriate lines and drains were placed for intraoperative monitoring. Perioperative antibiotics were administered per SCIP guidelines. The abdomen was prepped and draped in the usual sterile fashion. A pre-procedure timeout was taken verifying patient identity, surgical site and procedure to be performed.  A small infraumbilical skin incision was made, the subcutaneous tissue was divided with cautery, and the umbilical stalk was grasped and elevated. The fascia was incised and the peritoneal cavity was directly visualized. A 31mm Hassan trocar was placed. The peritoneal cavity was inspected with no evidence of visceral or vascular injury. Three 96mm ports were placed in the right subcostal margin, all under direct visualization. The fundus of the gallbladder was grasped and retracted cephalad. The infundibulum was retracted laterally. There were minimal omental adhesions to the gallbladder, which were taken down with cautery. The cystic triangle was dissected out using cautery and blunt dissection, and the critical view of safety  was obtained. The cystic duct and cystic artery were clipped and ligated. The gallbladder was taken off the liver using cautery. The specimen was placed in an endocatch bag and removed. The surgical site was irrigated with saline until the effluent was clear. Hemostasis was achieved in the gallbladder fossa using cautery. The cystic duct and artery stumps were visually inspected and there was no evidence of bile leak or bleeding. The ports were removed under direct visualization and the abdomen was desufflated. The umbilical port site fascia was closed with a 0 vicryl suture. The skin at all port sites was closed with 4-0 monocryl subcuticular suture. Dermabond was applied.  The patient tolerated the procedure well with no apparent complications. All counts were correct x2 at the end of the procedure. The patient was extubated and taken to PACU in stable condition.  Sophronia Simas, MD 12/12/20 10:15 AM

## 2020-12-12 NOTE — Plan of Care (Signed)

## 2020-12-12 NOTE — Anesthesia Preprocedure Evaluation (Addendum)
Anesthesia Evaluation  Patient identified by MRN, date of birth, ID band Patient awake    Reviewed: Allergy & Precautions, NPO status , Patient's Chart, lab work & pertinent test results  Airway Mallampati: III  TM Distance: >3 FB Neck ROM: Full    Dental no notable dental hx.    Pulmonary former smoker,    Pulmonary exam normal breath sounds clear to auscultation       Cardiovascular Exercise Tolerance: Good negative cardio ROS Normal cardiovascular exam Rhythm:Regular Rate:Normal     Neuro/Psych negative neurological ROS  negative psych ROS   GI/Hepatic negative GI ROS, Cholelithiasis   Endo/Other  negative endocrine ROS  Renal/GU negative Renal ROS  negative genitourinary   Musculoskeletal negative musculoskeletal ROS (+)   Abdominal   Peds negative pediatric ROS (+)  Hematology negative hematology ROS (+)   Anesthesia Other Findings   Reproductive/Obstetrics negative OB ROS                            Anesthesia Physical Anesthesia Plan  ASA: II  Anesthesia Plan: General   Post-op Pain Management:    Induction: Intravenous  PONV Risk Score and Plan: 2 and Midazolam, Treatment may vary due to age or medical condition and Ondansetron  Airway Management Planned: Oral ETT  Additional Equipment: None  Intra-op Plan:   Post-operative Plan: Extubation in OR  Informed Consent: I have reviewed the patients History and Physical, chart, labs and discussed the procedure including the risks, benefits and alternatives for the proposed anesthesia with the patient or authorized representative who has indicated his/her understanding and acceptance.     Dental advisory given and Interpreter used for interveiw  Plan Discussed with: CRNA, Anesthesiologist and Surgeon  Anesthesia Plan Comments:         Anesthesia Quick Evaluation

## 2020-12-12 NOTE — Progress Notes (Signed)
Patient arrived back to room from PACU in NAD, VS stable.

## 2020-12-12 NOTE — Transfer of Care (Signed)
Immediate Anesthesia Transfer of Care Note  Patient: Andrew Rowe  Procedure(s) Performed: LAPAROSCOPIC CHOLECYSTECTOMY (N/A Abdomen)  Patient Location: PACU  Anesthesia Type:General  Level of Consciousness: drowsy and patient cooperative  Airway & Oxygen Therapy: Patient Spontanous Breathing and Patient connected to face mask oxygen  Post-op Assessment: Report given to RN and Post -op Vital signs reviewed and stable  Post vital signs: Reviewed and stable  Last Vitals:  Vitals Value Taken Time  BP 116/62 12/12/20 1027  Temp    Pulse 67 12/12/20 1029  Resp 15 12/12/20 1029  SpO2 100 % 12/12/20 1029  Vitals shown include unvalidated device data.  Last Pain:  Vitals:   12/12/20 0715  TempSrc:   PainSc: Asleep         Complications: No complications documented.

## 2020-12-12 NOTE — Progress Notes (Signed)
Patient off floor to OR

## 2020-12-12 NOTE — Progress Notes (Signed)
Discharge teaching complete. Meds, diet, activity, follow up appointments and incision care reviewed and all questions answered. Diabetic education covered as well. Copy of instructions given to patient and meds brought to bedside by [facility] pharmacy. Interpreter, Mariel, present during entire discharge.

## 2020-12-12 NOTE — Anesthesia Procedure Notes (Signed)
Procedure Name: Intubation Date/Time: 12/12/2020 9:13 AM Performed by: Macie Burows, CRNA Pre-anesthesia Checklist: Patient identified, Emergency Drugs available, Suction available and Patient being monitored Patient Re-evaluated:Patient Re-evaluated prior to induction Oxygen Delivery Method: Circle system utilized Preoxygenation: Pre-oxygenation with 100% oxygen Induction Type: IV induction Ventilation: Mask ventilation without difficulty Tube type: Oral Tube size: 7.5 mm Number of attempts: 1 Airway Equipment and Method: Stylet and Oral airway Placement Confirmation: ETT inserted through vocal cords under direct vision,  positive ETCO2 and breath sounds checked- equal and bilateral Secured at: 23 cm Tube secured with: Tape Dental Injury: Teeth and Oropharynx as per pre-operative assessment

## 2020-12-12 NOTE — Discharge Instructions (Signed)
CIRUGIA LAPAROSCOPICA: INSTRUCCIONES DE POST OPERATORIO.  Revise siempre los documentos que le entreguen en el lugar donde se ha hecho la cirugia.  SI USTED NECESITA DOCUMENTOS DE INCAPACIDAD (DISABLE) O DE PERMISO FAMILAR (FAMILY LEAVE) NECESITA TRAERLOS A LA OFICINA PARA QUE SEAN PROCESADOS. NO  SE LOS DE A SU DOCTOR. 1. A su alta del hospital se le dara una receta para controlar el dolor. Tomela como ha sido recetada, si la necesita. Si no la necesita puede tomar, Acetaminofen (Tylenol) o Ibuprofen (Advil) para aliviar dolor moderado. 2. Continue tomando el resto de sus medicinas. 3. Si necesita rellenar la receta, llame a la farmacia. ellos contactan a nuestra oficina pidiendo autorizacion. Este tipo de receta no pueden ser rellenadas despues de las  5pm o durante los fines de semana. 4. Con relacion a la dieta: debe ser ligera los primeros dias despues que llege a la casa. Ejemplo: sopas y galleticas. Tome bastante liquido esos dias. 5. La mayoria de los pacientes padecen de inflamacion y cambio de coloracion de la piel alrededor de las incisiones. esto toma dias en resolver.  pnerse una bolsa de hielo en el area affectada ayuda..  6. Es comun tambien tener un poco de estrenimiento si esta tomado medicinas para el dolor. incremente la cantidad de liquidos a tomar y puede tomar (Colace) esto previene el problema. Si ya tiene estrenimiento, es decir no ha defecado en 48 horas, puede tomar un laxativo (Milk of Magnesia or Miralax) uselo como el paquete le explica. 7.  A menos que se le diga algo diferente. Remueva el bendaje a las 24-48 horas despues dela cirugia. y puede banarse en la ducha sin ningun problema. usted puede tener steri-strips (pequenas curitas transparentes en la piel puesta encima de la incision)  Estas banditas strips should be left on the skin for 7-10 days.   Si su cirujano puso pegamento encima de la incision usted puede banarse bajo la ducha en 24 horas. Este pegamento empezara a  caerse en las proximas 2-3 semanas. Si le pusieron suturas o presillas (grapos) estos seran quitados en su proxima cita en la oficina. . a. ACTIVIDADES:  Puede hacer actividad ligera.  Como caminar , subir escaleras y poco a poco irlas incrementando tanto como las tolere. Puede tener relaciones sexuales cuando sea comfortable. No carge objetos pesados o haga esfuerzos que no sean aprovados por su doctor. b. Puede manejar en cuanto no esta tomando medicamentos fuertes (narcoticos) para el dolor, pueda abrochar confortablemente el cinturon de seguridad, y pueda maniobrar y usar los pedales de su vehiculo con seguridad. c. PUEDE REGRESAR A TRABAJAR  8. Debe ver a su doctor para una cita de seguimiento en 2-3 semanas despues de la cirugia.   CUANDO LLAMAR A SU MEDICO: 1. FIEBRE mayor de  101.0 2. No produccion de orina. 3. Sangramiento continue de la herida 4. Incremento de dolor, enrojecimientio o drenaje de la herida (incision) 5. Incremento de dolor abdominal.  The clinic staff is available to answer your questions during regular business hours.  Please don't hesitate to call and ask to speak to one of the nurses for clinical concerns.  If you have a medical emergency, go to the nearest emergency room or call 911.  A surgeon from [facility] is always on call at the hospital. [address], [address], [place], [place]  [address] ? [address] [address], [place], [place]   [address] [phone] ? [phone] ? FAX [phone] Web site: [email or web address]  

## 2020-12-12 NOTE — Care Management (Signed)
Entered in [facility] ( Medication Assistance Through [facility]) with no co pay. Patient will not be eligible for [facility] for another year.   Ronny Flurry RN

## 2020-12-12 NOTE — Progress Notes (Signed)
Patient discharged home via wheelchair with friend.

## 2020-12-12 NOTE — Progress Notes (Signed)
Day of Surgery  Subjective: No acute changes. Tbili stable at 1.5. Patient denies abdominal pain.   Objective: Vital signs in last 24 hours: Temp:  [97.6 F (36.4 C)-99.6 F (37.6 C)] 97.6 F (36.4 C) (03/01 0417) Pulse Rate:  [61-118] 61 (03/01 0417) Resp:  [11-18] 18 (03/01 0417) BP: (101-143)/(42-94) 108/68 (03/01 0417) SpO2:  [98 %-100 %] 99 % (03/01 0417) Weight:  [81.6 kg] 81.6 kg (02/28 0932) Last BM Date: 12/10/20  Intake/Output from previous day: 02/28 0701 - 03/01 0700 In: 1000 [IV Piggyback:1000] Out: -  Intake/Output this shift: Total I/O In: 64.9 [I.V.:64.9] Out: -   PE: General: resting comfortably, NAD Neuro: alert and oriented Resp: normal work of breathing on room air Abdomen: soft, nondistended, nontender Extremities: warm and well-perfused  Lab Results:  Recent Labs    12/11/20 0943  WBC 9.5  HGB 15.1  HCT 44.7  PLT 268   BMET Recent Labs    12/11/20 0943 12/12/20 0406  NA 137 135  K 3.5 3.4*  CL 102 101  CO2 24 25  GLUCOSE 112* 108*  BUN 10 5*  CREATININE 0.83 0.76  CALCIUM 9.1 8.8*   PT/INR No results for input(s): LABPROT, INR in the last 72 hours. CMP     Component Value Date/Time   NA 135 12/12/2020 0406   K 3.4 (L) 12/12/2020 0406   CL 101 12/12/2020 0406   CO2 25 12/12/2020 0406   GLUCOSE 108 (H) 12/12/2020 0406   BUN 5 (L) 12/12/2020 0406   CREATININE 0.76 12/12/2020 0406   CALCIUM 8.8 (L) 12/12/2020 0406   PROT 6.1 (L) 12/12/2020 0406   ALBUMIN 3.5 12/12/2020 0406   AST 13 (L) 12/12/2020 0406   ALT 33 12/12/2020 0406   ALKPHOS 68 12/12/2020 0406   BILITOT 1.5 (H) 12/12/2020 0406   GFRNONAA >60 12/12/2020 0406   Lipase     Component Value Date/Time   LIPASE 30 12/11/2020 0943       Studies/Results: US Abdomen Limited RUQ (LIVER/GB)  Result Date: 12/11/2020 CLINICAL DATA:  Right upper quadrant abdominal pain. EXAM: ULTRASOUND ABDOMEN LIMITED RIGHT UPPER QUADRANT COMPARISON:  No pertinent prior  exams available for comparison. FINDINGS: Gallbladder: The gallbladder is somewhat underdistended. Cholecystolithiasis. This includes a 1.5 cm calculus near the gallbladder neck. No appreciable gallbladder wall thickening. No sonographic Eulah Pont sign is elicited by the scanning technologist. Common bile duct: Diameter: 1-2 mm, within normal limits Liver: No focal lesion identified. Normal parenchymal echogenicity. Portal vein is patent on color Doppler imaging with normal direction of blood flow towards the liver. IMPRESSION: Cholecystolithiasis. This includes a 1.5 cm calculus near the gallbladder neck. No sonographic evidence of acute cholecystitis. A nuclear medicine HIDA scan may be helpful for further evaluation, if clinically warranted. Electronically Signed   By: Jackey Loge DO   On: 12/11/2020 12:59    Anti-infectives: Anti-infectives (From admission, onward)   Start     Dose/Rate Route Frequency Ordered Stop   12/12/20 0700  ceFAZolin (ANCEF) IVPB 2g/100 mL premix        2 g 200 mL/hr over 30 Minutes Intravenous On call to O.R. 12/12/20 0355 12/13/20 0559       Assessment/Plan 25 yo male with symptomatic cholelithiasis and gallstone lodged in gallbladder neck. - Proceed to OR for lap cholecystectomy. Informed consent obtained via Spanish interpreter, all questions answered. - NPO for procedure - Possible discharge home this afternoon after PO challenge    LOS: 0 days  Sophronia Simas, MD [facility] General, Hepatobiliary and Pancreatic Surgery 12/12/20 8:52 AM

## 2020-12-13 ENCOUNTER — Encounter (HOSPITAL_COMMUNITY): Payer: Self-pay | Admitting: Surgery

## 2020-12-13 ENCOUNTER — Encounter (HOSPITAL_COMMUNITY): Payer: Self-pay

## 2020-12-13 LAB — SURGICAL PATHOLOGY

## 2020-12-13 NOTE — Anesthesia Postprocedure Evaluation (Signed)
Anesthesia Post Note  Patient: Andrew Rowe  Procedure(s) Performed: LAPAROSCOPIC CHOLECYSTECTOMY (N/A Abdomen)     Patient location during evaluation: PACU Anesthesia Type: General Level of consciousness: awake Pain management: pain level controlled Vital Signs Assessment: post-procedure vital signs reviewed and stable Respiratory status: spontaneous breathing and respiratory function stable Cardiovascular status: stable Postop Assessment: no apparent nausea or vomiting Anesthetic complications: no   No complications documented.  Last Vitals:  Vitals:   12/12/20 1042 12/12/20 1100  BP: 110/69 117/77  Pulse: 70 73  Resp: 15 16  Temp:  (!) 36.3 C  SpO2: 99% 98%    Last Pain:  Vitals:   12/12/20 1202  TempSrc:   PainSc: 4                  Candra R Samir Ishaq

## 2021-02-28 ENCOUNTER — Ambulatory Visit
Admission: EM | Admit: 2021-02-28 | Discharge: 2021-02-28 | Disposition: A | Discharge status: Home / Self Care | Payer: Self-pay | Attending: Emergency Medicine | Admitting: Emergency Medicine

## 2021-02-28 ENCOUNTER — Other Ambulatory Visit: Payer: Self-pay

## 2021-02-28 DIAGNOSIS — N50812 Left testicular pain: Secondary | ICD-10-CM | POA: Insufficient documentation

## 2021-02-28 DIAGNOSIS — N50811 Right testicular pain: Secondary | ICD-10-CM | POA: Insufficient documentation

## 2021-02-28 MED ORDER — NAPROXEN 500 MG PO TABS: 500.0000 mg | ORAL_TABLET | Freq: Two times a day (BID) | ORAL | 0 refills | Status: AC

## 2021-02-28 NOTE — ED Triage Notes (Signed)
Pt c/o bilateral testicle pain x3 months after having surgery. States every time he stretches he feels them pulling.

## 2021-02-28 NOTE — ED Provider Notes (Signed)
[facility]    CSN: [id number] Arrival date & time: 02/28/21  0354      History   Chief Complaint Chief Complaint  Patient presents with  . Testicle Pain    HPI Andrew Rowe is a 25 y.o. male presenting today for evaluation of testicle pain.  Reports over the past 1.5 to 3 months he has had bilateral testicular pain, right worse than left.  Reports an intermittent discomfort which comes and goes.  Does feel symptoms started after straining, but denies any bulging or increased swelling with straining, coughing.  He denies any dysuria, penile discharge.  Denies any recent new partners.  Is in a monogamous relationship with wife.  Denies any history of testicular problems.  HPI  Past Medical History:  Diagnosis Date  . Gallstones     Patient Active Problem List   Diagnosis Date Noted  . Symptomatic cholelithiasis 12/11/2020  . Neck abscess 06/03/2015  . Carbuncle and Furuncle of Neck 06/03/2015    Past Surgical History:  Procedure Laterality Date  . CHOLECYSTECTOMY N/A 12/12/2020   Procedure: LAPAROSCOPIC CHOLECYSTECTOMY;  Surgeon: Fritzi Mandes, MD;  Location: [facility];  Service: General;  Laterality: N/A;  LAPAROSCOPIC CHOLECYSTECTOMY  . RADICAL NECK DISSECTION Right 06/03/2015   Procedure: IRRIGATION AND DEBRIDEMENT OF RIGHT POSTERIOR NECK CARBUNCLE;  Surgeon: Flo Shanks, MD;  Location: [facility] OR;  Service: ENT;  Laterality: Right;       Home Medications    Prior to Admission medications   Medication Sig Start Date End Date Taking? Authorizing Provider  naproxen (NAPROSYN) 500 MG tablet Take 1 tablet (500 mg total) by mouth 2 (two) times daily. 02/28/21  Yes Ovetta Bazzano C, PA-C  acetaminophen (TYLENOL) 325 MG tablet Take 2 tablets (650 mg total) by mouth every 6 (six) hours as needed for mild pain (or temp > 100). 12/12/20   Juliet Rude, PA-C  ibuprofen (ADVIL) 200 MG tablet Take 400 mg by mouth every 5 (five) hours as needed (pain).     [provider]    Family History Family History  Problem Relation Age of Onset  . Diabetes Mother   . Hypertension Mother   . Diabetes Maternal Aunt   . Hypertension Maternal Aunt   . Diabetes Maternal Grandmother   . Hypertension Maternal Grandmother     Social History Social History   Tobacco Use  . Smoking status: Former Games developer  . Smokeless tobacco: Former Engineer, water Use Topics  . Alcohol use: Yes    Comment: occ  . Drug use: No     Allergies   Patient has no known allergies.   Review of Systems Review of Systems  Constitutional: Negative for fatigue and fever.  HENT: Negative for sore throat.   Eyes: Negative for redness, itching and visual disturbance.  Respiratory: Negative for shortness of breath.   Cardiovascular: Negative for chest pain and leg swelling.  Gastrointestinal: Negative for abdominal pain, nausea and vomiting.  Genitourinary: Positive for testicular pain. Negative for difficulty urinating, dysuria, frequency, penile discharge, penile pain, penile swelling and scrotal swelling.  Musculoskeletal: Negative for arthralgias and myalgias.  Skin: Negative for color change, rash and wound.  Neurological: Negative for dizziness, syncope, weakness, light-headedness and headaches.     Physical Exam Triage Vital Signs ED Triage Vitals  Enc Vitals Group     BP      Pulse      Resp      Temp  Temp src      SpO2      Weight      Height      Head Circumference      Peak Flow      Pain Score      Pain Loc      Pain Edu?      Excl. in GC?    No data found.  Updated Vital Signs BP 114/60 (BP Location: Left Arm)   Pulse 89   Temp 98.1 F (36.7 C) (Oral)   Resp 18   SpO2 99%   Visual Acuity Right Eye Distance:   Left Eye Distance:   Bilateral Distance:    Right Eye Near:   Left Eye Near:    Bilateral Near:     Physical Exam Vitals and nursing note reviewed.  Constitutional:      Appearance: He is well-developed.      Comments: No acute distress  HENT:     Head: Normocephalic and atraumatic.     Nose: Nose normal.  Eyes:     Conjunctiva/sclera: Conjunctivae normal.  Cardiovascular:     Rate and Rhythm: Normal rate.  Pulmonary:     Effort: Pulmonary effort is normal. No respiratory distress.  Abdominal:     General: There is no distension.  Genitourinary:    Comments: Penis circumcised, no rashes or lesions noted, no discharge noted, bilateral testicular is equal, no masses palpated, tenderness to palpation of right testicle, no bulges noted in inguinal canal with straining, tenderness to palpation in right lower abdominal/pubic area, without palpable defect Musculoskeletal:        General: Normal range of motion.     Cervical back: Neck supple.  Skin:    General: Skin is warm and dry.  Neurological:     Mental Status: He is alert and oriented to person, place, and time.      UC Treatments / Results  Labs (all labs ordered are listed, but only abnormal results are displayed) Labs Reviewed  CYTOLOGY, (ORAL, ANAL, URETHRAL) ANCILLARY ONLY    EKG   Radiology No results found.  Procedures Procedures (including critical care time)  Medications Ordered in UC Medications - No data to display  Initial Impression / Assessment and Plan / UC Course  I have reviewed the triage vital signs and the nursing notes.  Pertinent labs & imaging results that were available during my care of the patient were reviewed by me and considered in my medical decision making (see chart for details).     Testicular pain-low suspicion of torsion given symptoms times months, urethral swab pending to rule out any STDs/epididymitis, more concerning for possible underlying hernia versus hydrocele and recommended follow-up with primary care to have follow-up ultrasound/imaging is warranted.  Contact provided.  Discussed strict return precautions. Patient verbalized understanding and is agreeable with  plan.  Final Clinical Impressions(s) / UC Diagnoses   Final diagnoses:  Pain in both testicles     Discharge Instructions     Urethral swab to screen for any infections, should return in approximately 2 to 3 days Naprosyn twice daily to help with pain Follow-up with primary care-I recommend follow-up imaging such as ultrasound or CT to further evaluate cause of pain    ED Prescriptions    Medication Sig Dispense Auth. Provider   naproxen (NAPROSYN) 500 MG tablet Take 1 tablet (500 mg total) by mouth 2 (two) times daily. 30 tablet Derry Kassel, Junius Creamer, PA-C     PDMP  not reviewed this encounter.   Lew Dawes, PA-C 02/28/21 1038

## 2021-02-28 NOTE — ED Triage Notes (Signed)
Pt not in waiting area. Called pt and notified a room is ready. Pt hasn't came in at this point.

## 2021-02-28 NOTE — Discharge Instructions (Signed)
Urethral swab to screen for any infections, should return in approximately 2 to 3 days Naprosyn twice daily to help with pain Follow-up with primary care-I recommend follow-up imaging such as ultrasound or CT to further evaluate cause of pain

## 2021-03-01 LAB — CYTOLOGY, (ORAL, ANAL, URETHRAL) ANCILLARY ONLY
Chlamydia: NEGATIVE
Comment: NEGATIVE
Comment: NEGATIVE
Comment: NORMAL
Neisseria Gonorrhea: NEGATIVE
Trichomonas: NEGATIVE

## 2022-01-01 IMAGING — US US ABDOMEN LIMITED
1 series · 14 of 25 positions shown · non-contrast
Comparison: No prior.

CLINICAL DATA: Right upper quadrant pain.

EXAM:
ULTRASOUND ABDOMEN LIMITED RIGHT UPPER QUADRANT

[Series 1: us abdomen limited · 14 of 41 slices shown]
[im 1/41]
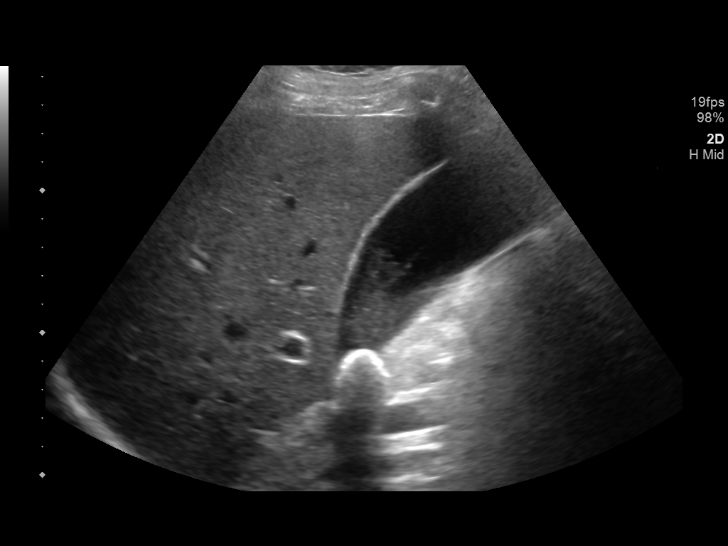
[im 4/41]
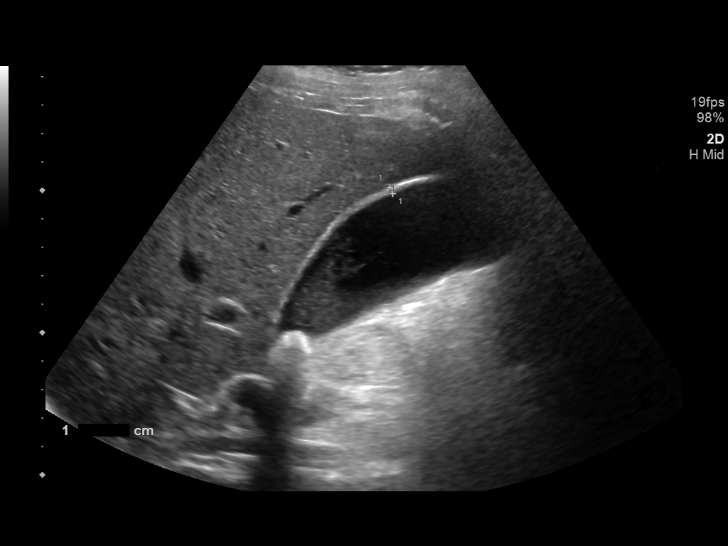
[im 7/41]
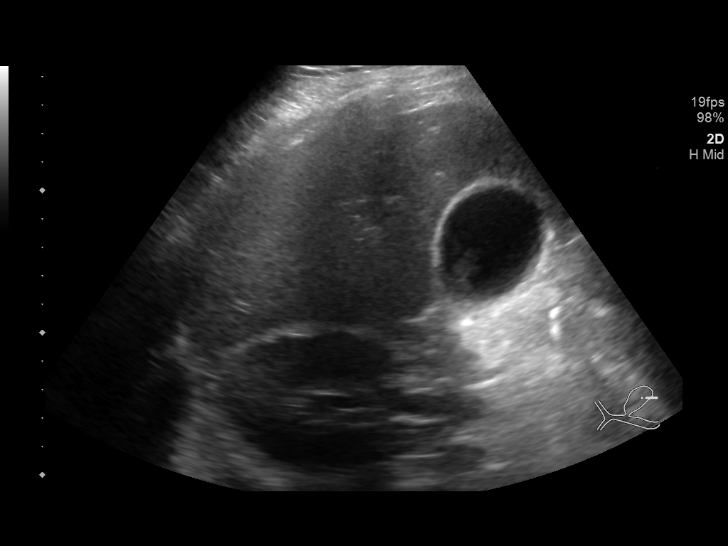
[im 11/41]
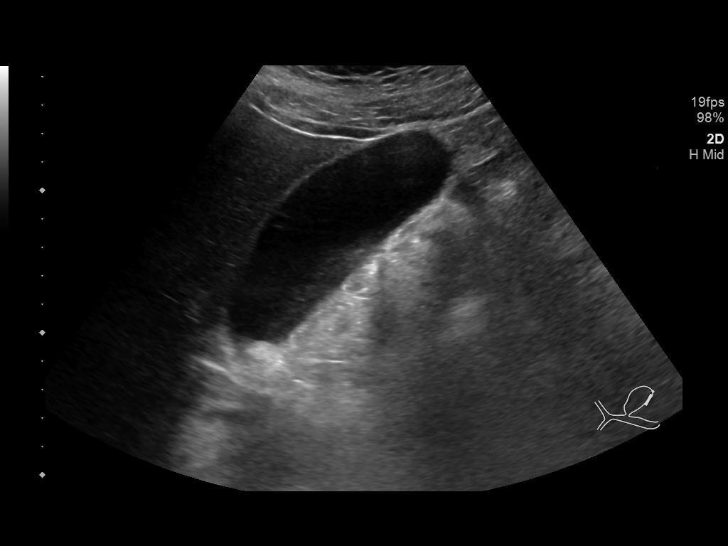
[im 14/41]
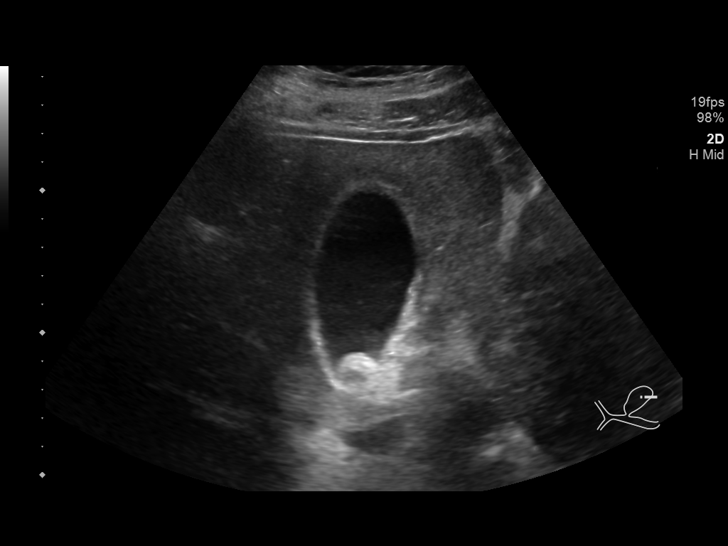
[im 16/41]
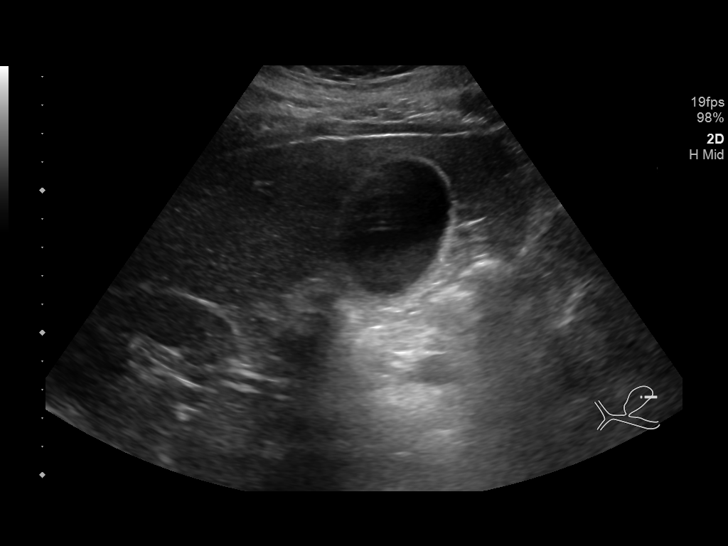
[im 19/41]
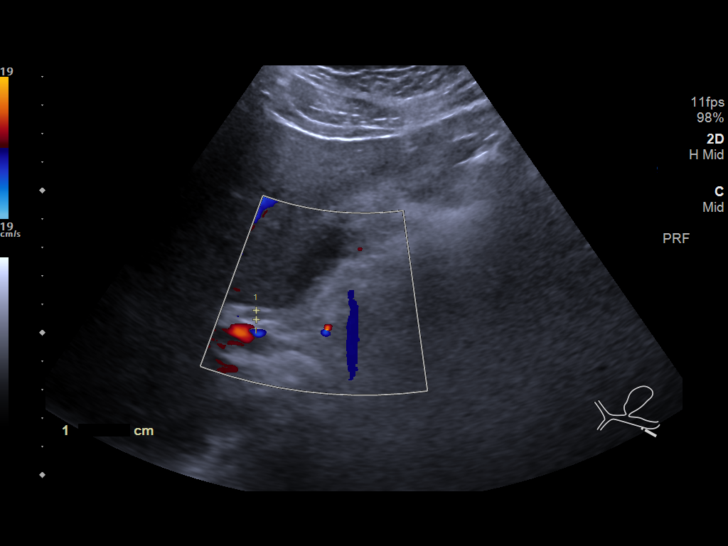
[im 22/41]
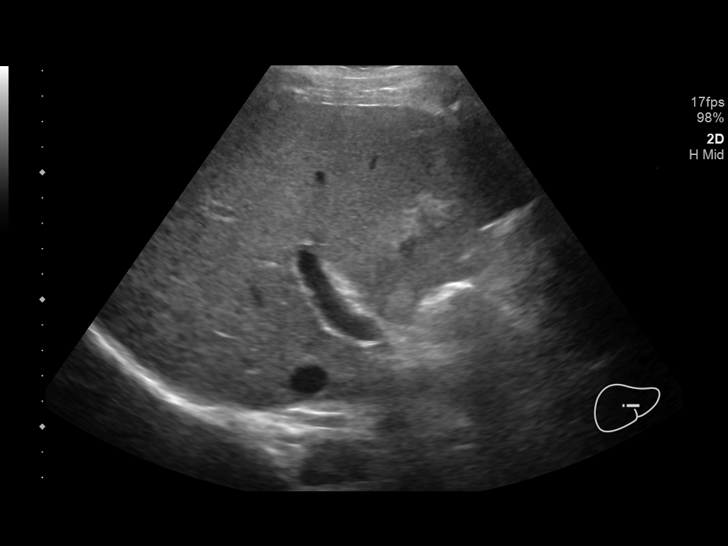
[im 26/41]
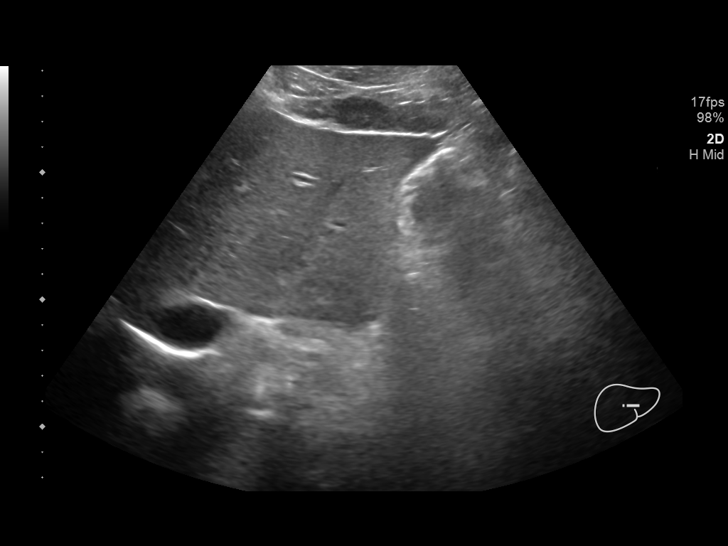
[im 27/41]
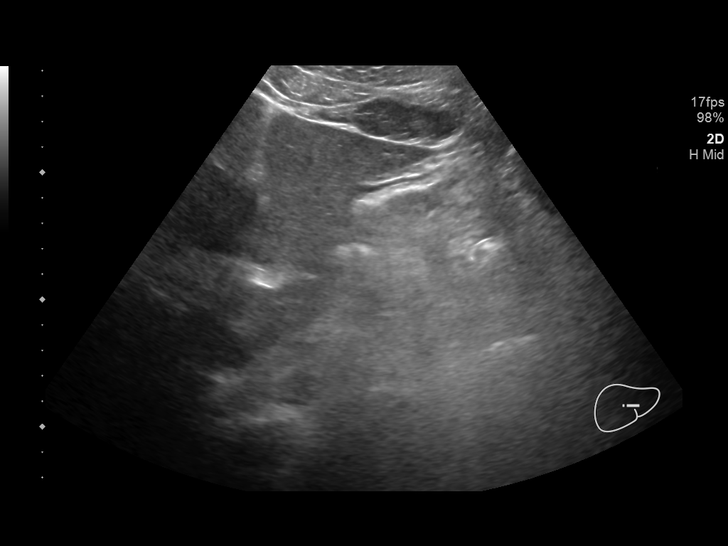
[im 31/41]
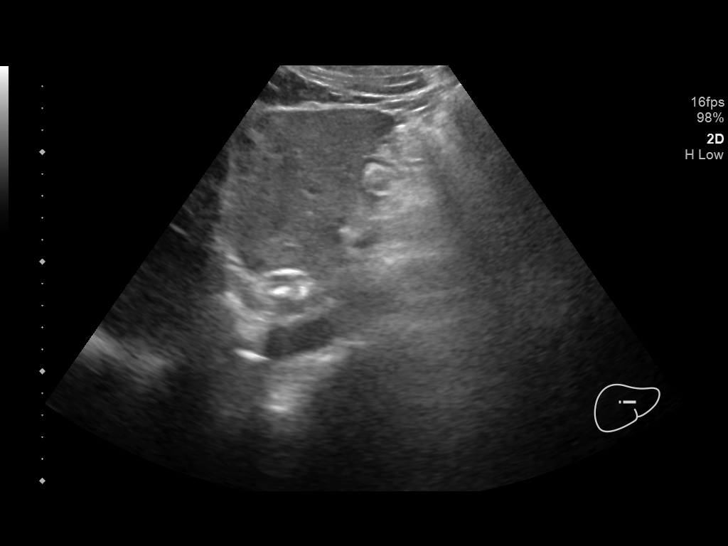
[im 34/41]
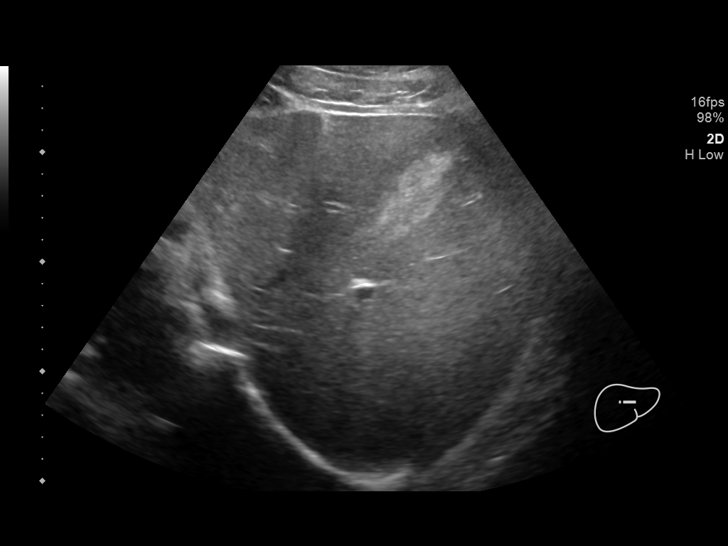
[im 37/41]
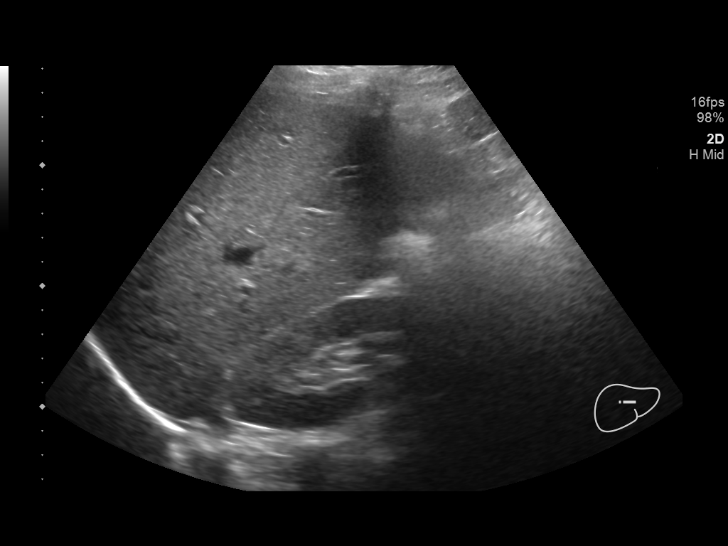
[im 41/41]
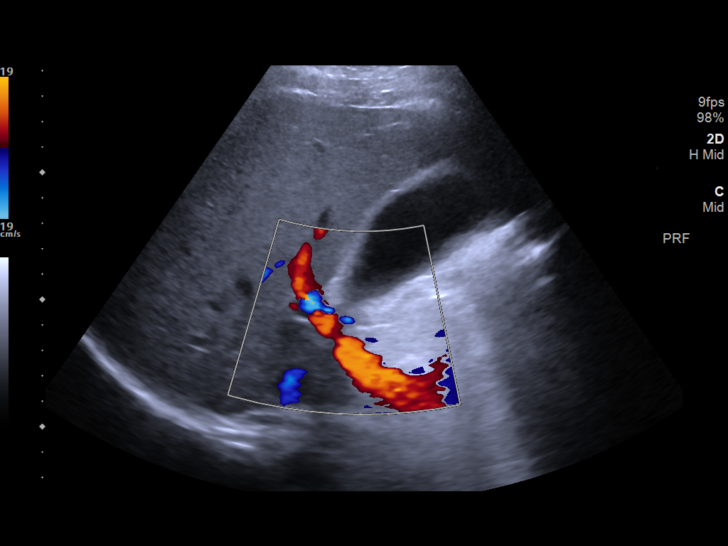

[14 of 25 positions shown; findings below may reference images not displayed]

FINDINGS: Gallbladder:

1.9 cm gallstone noted the neck of the gallbladder. Gallbladder
sludge noted. Gallbladder wall thickness normal. Negative Murphy
sign. No pericholecystic fluid collection.

Common bile duct:

Diameter: There is

Liver:

Mild increased echogenicity suggesting fatty infiltration. Portal
vein is patent on color Doppler imaging with normal direction of
blood flow towards the liver.

Other: None.
IMPRESSION: 1. 1.9 cm gallstone noted in the neck of the gallbladder.
Gallbladder sludge noted. No evidence of gallbladder wall
thickening. Negative Murphy sign. No pericholecystic fluid
collection. No biliary distention.
2. Mild fatty infiltration of the liver.
